# Patient Record
Sex: Female | Born: 1960
Health system: Southern US, Community
[De-identification: ages and names within clinical notes are randomized; demographics above are authoritative.]

## PROBLEM LIST (undated history)

## (undated) DIAGNOSIS — G1221 Amyotrophic lateral sclerosis: Secondary | ICD-10-CM

## (undated) DIAGNOSIS — M1990 Unspecified osteoarthritis, unspecified site: Secondary | ICD-10-CM

## (undated) DIAGNOSIS — I10 Essential (primary) hypertension: Secondary | ICD-10-CM

## (undated) DIAGNOSIS — F419 Anxiety disorder, unspecified: Secondary | ICD-10-CM

## (undated) DIAGNOSIS — G709 Myoneural disorder, unspecified: Secondary | ICD-10-CM

## (undated) DIAGNOSIS — R0600 Dyspnea, unspecified: Secondary | ICD-10-CM

## (undated) DIAGNOSIS — T4145XA Adverse effect of unspecified anesthetic, initial encounter: Secondary | ICD-10-CM

## (undated) DIAGNOSIS — F329 Major depressive disorder, single episode, unspecified: Secondary | ICD-10-CM

## (undated) DIAGNOSIS — T8859XA Other complications of anesthesia, initial encounter: Secondary | ICD-10-CM

## (undated) DIAGNOSIS — F32A Depression, unspecified: Secondary | ICD-10-CM

## (undated) HISTORY — PX: BILATERAL CARPAL TUNNEL RELEASE: SHX6508

## (undated) HISTORY — PX: APPENDECTOMY: SHX54

## (undated) HISTORY — PX: JOINT REPLACEMENT: SHX530

---

## 2006-04-03 ENCOUNTER — Ambulatory Visit (HOSPITAL_COMMUNITY): Admission: RE | Admit: 2006-04-03 | Discharge: 2006-04-03 | Payer: Self-pay | Admitting: Orthopedic Surgery

## 2010-07-05 ENCOUNTER — Encounter
Admission: RE | Admit: 2010-07-05 | Discharge: 2010-07-05 | Payer: Self-pay | Source: Home / Self Care | Attending: Rheumatology | Admitting: Rheumatology

## 2010-11-10 NOTE — Op Note (Signed)
NAME:  Tara Whitehead, Tara Whitehead               ACCOUNT NO.:  [id number]   MEDICAL RECORD NO.:  [id number]          PATIENT TYPE:  AMB   LOCATION:  SDS                          FACILITY:  [facility]   PHYSICIAN:  Artist Pais. Weingold, M.D.DATE OF BIRTH:  [birth year]   DATE OF PROCEDURE:  04/03/2006  DATE OF DISCHARGE:                                 OPERATIVE REPORT   PREOPERATIVE DIAGNOSIS:  Chronic left carpal tunnel syndrome.   POSTOPERATIVE DIAGNOSIS:  Chronic left carpal tunnel syndrome.   SURGERY:  Left carpal tunnel release.   SURGEON:  Artist Pais. Mina Marble, M.D.   ASSISTANT:  None.   ANESTHESIA:  General.   TOURNIQUET TIME:  16 minutes.   COMPLICATIONS:  None.   DRAINS:  None.   OPERATIVE REPORT:  The patient was taken to the operating suite after the  induction of adequate general anesthesia.  The left upper extremity was  prepped and draped in the usual sterile fashion.  An esmarch was used to  exsanguinate the limb.  Tourniquet was then inflated to 275 mmHg.  At this  point in time, a 2-cm incision was made in the palmar aspect of the left  hand in line with the long finger metacarpal, starting at Kaplan's cardinal  line.  The skin was incised.  The palmar fascia was identified and split.  The distal edge of the transverse carpal ligament was identified and split  with a 15-blade.  The Therapist, nutritional was used to identify and protect the  median nerve.  Once this was done, the remaining aspect of the transverse  carpal ligament was divided under direct vision using a curved blunt  scissor.  The canal was inspected.  There were no osseous lesions or  ganglions present.  It was irrigated and loosely closed with 3-0 Prolene  subcuticular stitch.  Steri-Strips, 4 x 4 fluffs and a compressive dressing  were applied.  The patient tolerated the procedure well and was taken to the  recovery room in a stable fashion.      Artist Pais Mina Marble, M.D.  Electronically Signed     MAW/MEDQ   D:  04/03/2006  T:  04/04/2006  Job:  [id number]

## 2016-11-23 DIAGNOSIS — G1221 Amyotrophic lateral sclerosis: Secondary | ICD-10-CM

## 2016-11-23 HISTORY — DX: Amyotrophic lateral sclerosis: G12.21

## 2017-05-27 DIAGNOSIS — R32 Unspecified urinary incontinence: Secondary | ICD-10-CM | POA: Diagnosis not present

## 2017-05-27 DIAGNOSIS — N3281 Overactive bladder: Secondary | ICD-10-CM | POA: Diagnosis not present

## 2017-06-26 DIAGNOSIS — R253 Fasciculation: Secondary | ICD-10-CM | POA: Diagnosis not present

## 2017-06-26 DIAGNOSIS — R292 Abnormal reflex: Secondary | ICD-10-CM | POA: Diagnosis not present

## 2017-06-26 DIAGNOSIS — G1221 Amyotrophic lateral sclerosis: Secondary | ICD-10-CM | POA: Diagnosis not present

## 2017-06-26 DIAGNOSIS — R531 Weakness: Secondary | ICD-10-CM | POA: Diagnosis not present

## 2017-07-01 DIAGNOSIS — N3941 Urge incontinence: Secondary | ICD-10-CM | POA: Diagnosis not present

## 2017-07-01 DIAGNOSIS — R3914 Feeling of incomplete bladder emptying: Secondary | ICD-10-CM | POA: Diagnosis not present

## 2017-07-02 ENCOUNTER — Other Ambulatory Visit: Payer: Self-pay | Admitting: Urology

## 2017-07-04 ENCOUNTER — Other Ambulatory Visit: Payer: Self-pay

## 2017-07-04 ENCOUNTER — Encounter (HOSPITAL_COMMUNITY): Payer: Self-pay | Admitting: *Deleted

## 2017-07-04 NOTE — Progress Notes (Signed)
Spoke with Anesthesia and Patient does not need to be seen by anesthesia prior to surgery regarding ALS diagnosis.  She can be evaluated am of surgery per Anesthesia.

## 2017-07-08 DIAGNOSIS — G1221 Amyotrophic lateral sclerosis: Secondary | ICD-10-CM | POA: Diagnosis not present

## 2017-07-08 DIAGNOSIS — R05 Cough: Secondary | ICD-10-CM | POA: Diagnosis not present

## 2017-07-08 DIAGNOSIS — R0989 Other specified symptoms and signs involving the circulatory and respiratory systems: Secondary | ICD-10-CM | POA: Diagnosis not present

## 2017-07-10 NOTE — Anesthesia Preprocedure Evaluation (Addendum)
Anesthesia Evaluation  Patient identified by MRN, date of birth, ID band Patient awake    Reviewed: Allergy & Precautions, NPO status , Patient's Chart, lab work & pertinent test results  History of Anesthesia Complications Negative for: history of anesthetic complications  Airway Mallampati: II  TM Distance: >3 FB Neck ROM: Full    Dental no notable dental hx. (+) Dental Advisory Given   Pulmonary shortness of breath and with exertion, Current Smoker,    Pulmonary exam normal        Cardiovascular hypertension, negative cardio ROS Normal cardiovascular exam     Neuro/Psych PSYCHIATRIC DISORDERS Anxiety Depression ALS    GI/Hepatic negative GI ROS, Neg liver ROS,   Endo/Other  Morbid obesity  Renal/GU negative Renal ROS     Musculoskeletal negative musculoskeletal ROS (+)   Abdominal   Peds  Hematology negative hematology ROS (+)   Anesthesia Other Findings Day of surgery medications reviewed with the patient.  Reproductive/Obstetrics                            Anesthesia Physical Anesthesia Plan  ASA: III  Anesthesia Plan: MAC   Post-op Pain Management:    Induction:   PONV Risk Score and Plan: 2 and Ondansetron and Dexamethasone  Airway Management Planned: Natural Airway  Additional Equipment:   Intra-op Plan:   Post-operative Plan:   Informed Consent: I have reviewed the patients History and Physical, chart, labs and discussed the procedure including the risks, benefits and alternatives for the proposed anesthesia with the patient or authorized representative who has indicated his/her understanding and acceptance.   Dental advisory given  Plan Discussed with: CRNA, Anesthesiologist and Surgeon  Anesthesia Plan Comments:        Anesthesia Quick Evaluation

## 2017-07-10 NOTE — H&P (Signed)
CC/HPI: I leak when I have the urge to urinate.     Tara Whitehead is a 57 yo with a 6 month history of ALS. She has UUI and had UDS at [facility] in December that showed a capacity bladder with instability. She is confined to a wheelchair and has both day and nighttime UUI. She has had no UTI's. She is voiding into depends and that aggravates the hydradinitis that she has. She has no other GU history. She has a horseshoe kidney.     ALLERGIES: None   MEDICATIONS: None   GU PSH: None   NON-GU PSH: Appendectomy (laparoscopic) Carpal tunnel surgery, Bilateral Knee Arthroscopy/surgery, Bilateral    GU PMH: None   NON-GU PMH: Amyotrophic lateral sclerosis Anxiety Arthritis Depression Hypertension Sleep Apnea    FAMILY HISTORY: Heart Attack - Brother multiple sclerosis - Mother   SOCIAL HISTORY: None   REVIEW OF SYSTEMS:    GU Review Female:   Patient reports frequent urination, hard to postpone urination, get up at night to urinate, and leakage of urine. Patient denies burning /pain with urination, stream starts and stops, trouble starting your stream, have to strain to urinate, and being pregnant.  Gastrointestinal (Upper):   Patient denies nausea, vomiting, and indigestion/ heartburn.  Gastrointestinal (Lower):   Patient denies diarrhea and constipation.  Constitutional:   Patient reports fatigue. Patient denies fever, night sweats, and weight loss.  Skin:   Patient reports skin rash/ lesion. Patient denies itching.  Eyes:   Patient denies blurred vision and double vision.  Ears/ Nose/ Throat:   Patient reports sore throat. Patient denies sinus problems.  Hematologic/Lymphatic:   Patient denies swollen glands and easy bruising.  Cardiovascular:   Patient reports leg swelling. Patient denies chest pains.  Respiratory:   Patient reports cough and shortness of breath.   Endocrine:   Patient denies excessive thirst.  Musculoskeletal:   Patient denies back pain and joint pain.   Neurological:   Patient denies headaches and dizziness.  Psychologic:   Patient reports depression and anxiety.    VITAL SIGNS:      07/01/2017 10:16 AM  Weight 270 lb / 122.47 kg  Height 71 in / 180.34 cm  BP 166/77 mmHg  Heart Rate 82 /min  Temperature 97.4 F / 36.3 C  BMI 37.7 kg/m   GU PHYSICAL EXAMINATION:    External Genitalia: She has erythema on the groins and labial with a cream applied.    MULTI-SYSTEM PHYSICAL EXAMINATION:    Constitutional: Well-nourished. No physical deformities. Normally developed. Good grooming.  Neck: Neck symmetrical, not swollen. Normal tracheal position.  Respiratory: No labored breathing, no use of accessory muscles. CTA  Cardiovascular: Normal temperature, RRR without murmur.  Lymphatic: No enlargement of neck, axillae, groin.  Skin: No paleness, no jaundice, no cyanosis. No lesion, no ulcer, no rash.  Neurologic / Psychiatric: Oriented to time, oriented to place, oriented to person. No depression, no anxiety, no agitation. She has quadriparesis.   Gastrointestinal: Obese abdomen. No mass, no tenderness, no rigidity.   Musculoskeletal: Normal gait and station of head and neck.     PAST DATA REVIEWED:  Source Of History:  Patient  Urodynamics Review:   Review Urodynamics Tests   PROCEDURES:          Catheter / SP Tube - 51701 In and Out Catheterization  A 14 French red rubber or straight catheter was inserted into the bladder using sterile technique. A urinalysis was sent to the lab. 260  cc of urine was obtained. A urine culture was sent to the lab.         Urinalysis Dipstick Dipstick Cont'd  Color: Yellow Bilirubin: Neg  Appearance: Clear Ketones: Neg  Specific Gravity: 1.020 Blood: Neg  pH: 6.5 Protein: Neg  Glucose: Neg Urobilinogen: 0.2    Nitrites: Neg    Leukocyte Esterase: Neg    ASSESSMENT:      ICD-10 Details  1 GU:   Urge incontinence - N39.41 She has an unstable small capacity bladder with incontinence and ALS  which limits mobility. She has not tolerated a foley and couldn't do CIC. I will get her set up for cystoscopy in botox and placement of an SP tube. I reviewed the risks of bleeding, infection, injury to bowel or other structures, neurologic side effects from the botox, thrombotic events and anesthetic complications. Cath UA was clear so she doesn't need a culture.   2   Incomplete bladder emptying - R39.14 PVR was   PLAN:           Schedule Return Visit/Planned Activity: Next Available Appointment - Schedule Surgery          Document Letter(s):  Created for Patient: Clinical Summary

## 2017-07-11 ENCOUNTER — Inpatient Hospital Stay (HOSPITAL_COMMUNITY)
Admission: RE | Admit: 2017-07-11 | Discharge: 2017-07-23 | DRG: 698 | Disposition: A | Discharge status: Hospice | Payer: PPO | Source: Ambulatory Visit | Attending: Internal Medicine | Admitting: Internal Medicine

## 2017-07-11 ENCOUNTER — Ambulatory Visit (HOSPITAL_COMMUNITY): Payer: PPO | Admitting: Anesthesiology

## 2017-07-11 ENCOUNTER — Other Ambulatory Visit: Payer: Self-pay

## 2017-07-11 ENCOUNTER — Encounter (HOSPITAL_COMMUNITY)
Admission: RE | Disposition: A | Discharge status: Hospice | Payer: Self-pay | Source: Ambulatory Visit | Attending: Internal Medicine

## 2017-07-11 ENCOUNTER — Encounter (HOSPITAL_COMMUNITY): Payer: Self-pay | Admitting: *Deleted

## 2017-07-11 DIAGNOSIS — Q631 Lobulated, fused and horseshoe kidney: Secondary | ICD-10-CM

## 2017-07-11 DIAGNOSIS — J9621 Acute and chronic respiratory failure with hypoxia: Secondary | ICD-10-CM | POA: Diagnosis not present

## 2017-07-11 DIAGNOSIS — J181 Lobar pneumonia, unspecified organism: Secondary | ICD-10-CM | POA: Diagnosis not present

## 2017-07-11 DIAGNOSIS — I10 Essential (primary) hypertension: Secondary | ICD-10-CM | POA: Diagnosis present

## 2017-07-11 DIAGNOSIS — J9811 Atelectasis: Secondary | ICD-10-CM | POA: Diagnosis not present

## 2017-07-11 DIAGNOSIS — Z8249 Family history of ischemic heart disease and other diseases of the circulatory system: Secondary | ICD-10-CM

## 2017-07-11 DIAGNOSIS — Z79899 Other long term (current) drug therapy: Secondary | ICD-10-CM

## 2017-07-11 DIAGNOSIS — F17210 Nicotine dependence, cigarettes, uncomplicated: Secondary | ICD-10-CM | POA: Diagnosis present

## 2017-07-11 DIAGNOSIS — Z6841 Body Mass Index (BMI) 40.0 and over, adult: Secondary | ICD-10-CM | POA: Diagnosis not present

## 2017-07-11 DIAGNOSIS — Z91048 Other nonmedicinal substance allergy status: Secondary | ICD-10-CM | POA: Diagnosis not present

## 2017-07-11 DIAGNOSIS — N3941 Urge incontinence: Secondary | ICD-10-CM | POA: Diagnosis not present

## 2017-07-11 DIAGNOSIS — Z435 Encounter for attention to cystostomy: Secondary | ICD-10-CM | POA: Diagnosis not present

## 2017-07-11 DIAGNOSIS — G8194 Hemiplegia, unspecified affecting left nondominant side: Secondary | ICD-10-CM | POA: Diagnosis not present

## 2017-07-11 DIAGNOSIS — J9600 Acute respiratory failure, unspecified whether with hypoxia or hypercapnia: Secondary | ICD-10-CM | POA: Diagnosis not present

## 2017-07-11 DIAGNOSIS — Z515 Encounter for palliative care: Secondary | ICD-10-CM | POA: Diagnosis not present

## 2017-07-11 DIAGNOSIS — R32 Unspecified urinary incontinence: Secondary | ICD-10-CM | POA: Diagnosis not present

## 2017-07-11 DIAGNOSIS — J189 Pneumonia, unspecified organism: Secondary | ICD-10-CM | POA: Diagnosis not present

## 2017-07-11 DIAGNOSIS — Z95810 Presence of automatic (implantable) cardiac defibrillator: Secondary | ICD-10-CM

## 2017-07-11 DIAGNOSIS — Z7189 Other specified counseling: Secondary | ICD-10-CM

## 2017-07-11 DIAGNOSIS — E441 Mild protein-calorie malnutrition: Secondary | ICD-10-CM | POA: Diagnosis not present

## 2017-07-11 DIAGNOSIS — G8250 Quadriplegia, unspecified: Secondary | ICD-10-CM | POA: Diagnosis not present

## 2017-07-11 DIAGNOSIS — J90 Pleural effusion, not elsewhere classified: Secondary | ICD-10-CM | POA: Diagnosis not present

## 2017-07-11 DIAGNOSIS — R0602 Shortness of breath: Secondary | ICD-10-CM

## 2017-07-11 DIAGNOSIS — Z820 Family history of epilepsy and other diseases of the nervous system: Secondary | ICD-10-CM

## 2017-07-11 DIAGNOSIS — K5900 Constipation, unspecified: Secondary | ICD-10-CM | POA: Diagnosis present

## 2017-07-11 DIAGNOSIS — T83038A Leakage of other indwelling urethral catheter, initial encounter: Secondary | ICD-10-CM | POA: Diagnosis not present

## 2017-07-11 DIAGNOSIS — N319 Neuromuscular dysfunction of bladder, unspecified: Principal | ICD-10-CM | POA: Diagnosis present

## 2017-07-11 DIAGNOSIS — I5030 Unspecified diastolic (congestive) heart failure: Secondary | ICD-10-CM | POA: Diagnosis not present

## 2017-07-11 DIAGNOSIS — G1221 Amyotrophic lateral sclerosis: Secondary | ICD-10-CM

## 2017-07-11 DIAGNOSIS — F418 Other specified anxiety disorders: Secondary | ICD-10-CM | POA: Diagnosis not present

## 2017-07-11 DIAGNOSIS — J9601 Acute respiratory failure with hypoxia: Secondary | ICD-10-CM | POA: Diagnosis not present

## 2017-07-11 DIAGNOSIS — Y95 Nosocomial condition: Secondary | ICD-10-CM | POA: Diagnosis present

## 2017-07-11 DIAGNOSIS — R0902 Hypoxemia: Secondary | ICD-10-CM

## 2017-07-11 DIAGNOSIS — Z66 Do not resuscitate: Secondary | ICD-10-CM | POA: Diagnosis not present

## 2017-07-11 DIAGNOSIS — E876 Hypokalemia: Secondary | ICD-10-CM | POA: Diagnosis present

## 2017-07-11 DIAGNOSIS — T884XXA Failed or difficult intubation, initial encounter: Secondary | ICD-10-CM | POA: Diagnosis not present

## 2017-07-11 DIAGNOSIS — L8990 Pressure ulcer of unspecified site, unspecified stage: Secondary | ICD-10-CM

## 2017-07-11 DIAGNOSIS — R339 Retention of urine, unspecified: Secondary | ICD-10-CM | POA: Diagnosis present

## 2017-07-11 DIAGNOSIS — A419 Sepsis, unspecified organism: Secondary | ICD-10-CM

## 2017-07-11 DIAGNOSIS — R000 Tachycardia, unspecified: Secondary | ICD-10-CM | POA: Diagnosis present

## 2017-07-11 DIAGNOSIS — L89151 Pressure ulcer of sacral region, stage 1: Secondary | ICD-10-CM | POA: Diagnosis present

## 2017-07-11 DIAGNOSIS — J9690 Respiratory failure, unspecified, unspecified whether with hypoxia or hypercapnia: Secondary | ICD-10-CM | POA: Diagnosis not present

## 2017-07-11 DIAGNOSIS — G4730 Sleep apnea, unspecified: Secondary | ICD-10-CM | POA: Diagnosis present

## 2017-07-11 DIAGNOSIS — R0689 Other abnormalities of breathing: Secondary | ICD-10-CM | POA: Diagnosis not present

## 2017-07-11 DIAGNOSIS — Z993 Dependence on wheelchair: Secondary | ICD-10-CM

## 2017-07-11 HISTORY — DX: Anxiety disorder, unspecified: F41.9

## 2017-07-11 HISTORY — DX: Depression, unspecified: F32.A

## 2017-07-11 HISTORY — DX: Adverse effect of unspecified anesthetic, initial encounter: T41.45XA

## 2017-07-11 HISTORY — DX: Myoneural disorder, unspecified: G70.9

## 2017-07-11 HISTORY — DX: Major depressive disorder, single episode, unspecified: F32.9

## 2017-07-11 HISTORY — DX: Amyotrophic lateral sclerosis: G12.21

## 2017-07-11 HISTORY — DX: Dyspnea, unspecified: R06.00

## 2017-07-11 HISTORY — DX: Essential (primary) hypertension: I10

## 2017-07-11 HISTORY — DX: Unspecified osteoarthritis, unspecified site: M19.90

## 2017-07-11 HISTORY — DX: Other complications of anesthesia, initial encounter: T88.59XA

## 2017-07-11 HISTORY — PX: BOTOX INJECTION: SHX5754

## 2017-07-11 HISTORY — PX: INSERTION OF SUPRAPUBIC CATHETER: SHX5870

## 2017-07-11 LAB — CBC
HCT: 50.4 % — ABNORMAL HIGH (ref 36.0–46.0)
Hemoglobin: 17.1 g/dL — ABNORMAL HIGH (ref 12.0–15.0)
MCH: 29.3 pg (ref 26.0–34.0)
MCHC: 33.9 g/dL (ref 30.0–36.0)
MCV: 86.4 fL (ref 78.0–100.0)
PLATELETS: 259 10*3/uL (ref 150–400)
RBC: 5.83 MIL/uL — ABNORMAL HIGH (ref 3.87–5.11)
RDW: 15.2 % (ref 11.5–15.5)
WBC: 12.7 10*3/uL — ABNORMAL HIGH (ref 4.0–10.5)

## 2017-07-11 LAB — BASIC METABOLIC PANEL
Anion gap: 7 (ref 5–15)
BUN: 17 mg/dL (ref 6–20)
CALCIUM: 9.4 mg/dL (ref 8.9–10.3)
CO2: 33 mmol/L — ABNORMAL HIGH (ref 22–32)
Chloride: 102 mmol/L (ref 101–111)
GLUCOSE: 103 mg/dL — AB (ref 65–99)
Potassium: 4.9 mmol/L (ref 3.5–5.1)
SODIUM: 142 mmol/L (ref 135–145)

## 2017-07-11 SURGERY — BOTOX INJECTION
Anesthesia: Monitor Anesthesia Care

## 2017-07-11 MED ORDER — BUPROPION HCL ER (XL) 150 MG PO TB24
150.0000 mg | ORAL_TABLET | Freq: Every day | ORAL | Status: DC
  Administered 2017-07-12 – 2017-07-15 (×3): 150 mg via ORAL
  Filled 2017-07-11 (×3): qty 1

## 2017-07-11 MED ORDER — FENTANYL CITRATE (PF) 100 MCG/2ML IJ SOLN
INTRAMUSCULAR | Status: AC
  Filled 2017-07-11: qty 2

## 2017-07-11 MED ORDER — ONABOTULINUMTOXINA 100 UNITS IJ SOLR
100.0000 [IU] | Freq: Once | INTRAMUSCULAR | Status: DC
  Filled 2017-07-11: qty 100

## 2017-07-11 MED ORDER — BELLADONNA-OPIUM 16.2-30 MG RE SUPP: 1.0000 | Freq: Four times a day (QID) | RECTAL | Status: DC | PRN

## 2017-07-11 MED ORDER — SENNOSIDES-DOCUSATE SODIUM 8.6-50 MG PO TABS
2.0000 | ORAL_TABLET | Freq: Every day | ORAL | Status: DC
  Administered 2017-07-11 – 2017-07-14 (×2): 2 via ORAL
  Filled 2017-07-11 (×2): qty 2

## 2017-07-11 MED ORDER — SODIUM CHLORIDE 0.9 % IJ SOLN
INTRAMUSCULAR | Status: DC | PRN
  Administered 2017-07-11: 30 mL

## 2017-07-11 MED ORDER — CEFAZOLIN SODIUM-DEXTROSE 2-4 GM/100ML-% IV SOLN
2.0000 g | Freq: Three times a day (TID) | INTRAVENOUS | Status: AC
  Administered 2017-07-11 – 2017-07-12 (×2): 2 g via INTRAVENOUS
  Filled 2017-07-11 (×2): qty 100

## 2017-07-11 MED ORDER — HYDRALAZINE HCL 20 MG/ML IJ SOLN
INTRAMUSCULAR | Status: AC
  Filled 2017-07-11: qty 1

## 2017-07-11 MED ORDER — ONABOTULINUMTOXINA 100 UNITS IJ SOLR
100.0000 [IU] | Freq: Once | INTRAMUSCULAR | Status: AC
  Administered 2017-07-11: 200 [IU] via INTRAMUSCULAR
  Filled 2017-07-11: qty 100

## 2017-07-11 MED ORDER — MIDAZOLAM HCL 2 MG/2ML IJ SOLN
INTRAMUSCULAR | Status: AC
  Filled 2017-07-11: qty 2

## 2017-07-11 MED ORDER — ONDANSETRON HCL 4 MG/2ML IJ SOLN: 4.0000 mg | INTRAMUSCULAR | Status: DC | PRN

## 2017-07-11 MED ORDER — ONDANSETRON HCL 4 MG/2ML IJ SOLN
INTRAMUSCULAR | Status: DC | PRN
  Administered 2017-07-11: 4 mg via INTRAVENOUS

## 2017-07-11 MED ORDER — CLOTRIMAZOLE 1 % EX CREA: 1.0000 "application " | TOPICAL_CREAM | Freq: Two times a day (BID) | CUTANEOUS | Status: DC | PRN

## 2017-07-11 MED ORDER — DOCUSATE SODIUM 100 MG PO CAPS
100.0000 mg | ORAL_CAPSULE | Freq: Two times a day (BID) | ORAL | Status: DC
  Administered 2017-07-11 – 2017-07-15 (×5): 100 mg via ORAL
  Filled 2017-07-11 (×6): qty 1

## 2017-07-11 MED ORDER — ACETAMINOPHEN 325 MG PO TABS
650.0000 mg | ORAL_TABLET | ORAL | Status: DC | PRN
  Administered 2017-07-11: 650 mg via ORAL
  Filled 2017-07-11: qty 2

## 2017-07-11 MED ORDER — CEFAZOLIN SODIUM-DEXTROSE 2-4 GM/100ML-% IV SOLN
2.0000 g | INTRAVENOUS | Status: AC
  Administered 2017-07-11: 2 g via INTRAVENOUS
  Filled 2017-07-11: qty 100

## 2017-07-11 MED ORDER — PROMETHAZINE HCL 25 MG/ML IJ SOLN: 6.2500 mg | INTRAMUSCULAR | Status: DC | PRN

## 2017-07-11 MED ORDER — FENTANYL CITRATE (PF) 100 MCG/2ML IJ SOLN
INTRAMUSCULAR | Status: DC | PRN
  Administered 2017-07-11: 100 ug via INTRAVENOUS

## 2017-07-11 MED ORDER — OXYBUTYNIN CHLORIDE ER 5 MG PO TB24
10.0000 mg | ORAL_TABLET | Freq: Every day | ORAL | Status: DC
  Administered 2017-07-12 – 2017-07-15 (×3): 10 mg via ORAL
  Filled 2017-07-11: qty 1
  Filled 2017-07-11 (×2): qty 2

## 2017-07-11 MED ORDER — SODIUM CHLORIDE 0.9 % IR SOLN
Status: DC | PRN
  Administered 2017-07-11: 3000 mL via INTRAVESICAL

## 2017-07-11 MED ORDER — TRIAMCINOLONE ACETONIDE 0.1 % EX CREA
1.0000 "application " | TOPICAL_CREAM | Freq: Two times a day (BID) | CUTANEOUS | Status: DC | PRN
  Administered 2017-07-20: 1 via TOPICAL

## 2017-07-11 MED ORDER — DEXAMETHASONE SODIUM PHOSPHATE 10 MG/ML IJ SOLN
INTRAMUSCULAR | Status: DC | PRN
  Administered 2017-07-11: 10 mg via INTRAVENOUS

## 2017-07-11 MED ORDER — PROPOFOL 10 MG/ML IV BOLUS
INTRAVENOUS | Status: AC
  Filled 2017-07-11: qty 40

## 2017-07-11 MED ORDER — SODIUM CHLORIDE 0.9 % IJ SOLN
INTRAMUSCULAR | Status: AC
Start: 2017-07-11 — End: ?
  Filled 2017-07-11: qty 50

## 2017-07-11 MED ORDER — LIDOCAINE 2% (20 MG/ML) 5 ML SYRINGE
INTRAMUSCULAR | Status: DC | PRN
  Administered 2017-07-11 (×2): 100 mg via INTRAVENOUS

## 2017-07-11 MED ORDER — ORAL CARE MOUTH RINSE
15.0000 mL | Freq: Two times a day (BID) | OROMUCOSAL | Status: DC
  Administered 2017-07-11 – 2017-07-22 (×14): 15 mL via OROMUCOSAL

## 2017-07-11 MED ORDER — LABETALOL HCL 5 MG/ML IV SOLN
INTRAVENOUS | Status: DC | PRN
  Administered 2017-07-11 (×2): 10 mg via INTRAVENOUS

## 2017-07-11 MED ORDER — VENLAFAXINE HCL ER 75 MG PO CP24
225.0000 mg | ORAL_CAPSULE | Freq: Every day | ORAL | Status: DC
  Administered 2017-07-12 – 2017-07-14 (×2): 225 mg via ORAL
  Filled 2017-07-11 (×4): qty 1

## 2017-07-11 MED ORDER — FENTANYL CITRATE (PF) 100 MCG/2ML IJ SOLN: 25.0000 ug | INTRAMUSCULAR | Status: DC | PRN

## 2017-07-11 MED ORDER — AMLODIPINE BESYLATE 5 MG PO TABS: 5.0000 mg | ORAL_TABLET | Freq: Every day | ORAL | Status: DC

## 2017-07-11 MED ORDER — PROPOFOL 10 MG/ML IV BOLUS
INTRAVENOUS | Status: AC
  Filled 2017-07-11: qty 20

## 2017-07-11 MED ORDER — LACTATED RINGERS IV SOLN
INTRAVENOUS | Status: DC
  Administered 2017-07-11 (×2): via INTRAVENOUS

## 2017-07-11 MED ORDER — OXYCODONE HCL 5 MG PO TABS
5.0000 mg | ORAL_TABLET | ORAL | Status: DC | PRN
  Administered 2017-07-12 – 2017-07-14 (×2): 5 mg via ORAL
  Filled 2017-07-11 (×2): qty 1

## 2017-07-11 MED ORDER — HYDRALAZINE HCL 20 MG/ML IJ SOLN
5.0000 mg | Freq: Once | INTRAMUSCULAR | Status: AC
  Administered 2017-07-11: 5 mg via INTRAVENOUS

## 2017-07-11 MED ORDER — PROPOFOL 10 MG/ML IV BOLUS
INTRAVENOUS | Status: AC
Start: 2017-07-11 — End: ?
  Filled 2017-07-11: qty 20

## 2017-07-11 MED ORDER — MORPHINE SULFATE (PF) 4 MG/ML IV SOLN: 2.0000 mg | INTRAVENOUS | Status: DC | PRN

## 2017-07-11 MED ORDER — LABETALOL HCL 5 MG/ML IV SOLN
INTRAVENOUS | Status: AC
  Filled 2017-07-11: qty 4

## 2017-07-11 MED ORDER — LIDOCAINE 2% (20 MG/ML) 5 ML SYRINGE
INTRAMUSCULAR | Status: AC
  Filled 2017-07-11: qty 5

## 2017-07-11 MED ORDER — PROPOFOL 10 MG/ML IV BOLUS
INTRAVENOUS | Status: DC | PRN
  Administered 2017-07-11: 100 mg via INTRAVENOUS
  Administered 2017-07-11: 10 mg via INTRAVENOUS
  Administered 2017-07-11: 20 mg via INTRAVENOUS
  Administered 2017-07-11: 50 mg via INTRAVENOUS
  Administered 2017-07-11: 40 mg via INTRAVENOUS
  Administered 2017-07-11: 30 mg via INTRAVENOUS

## 2017-07-11 MED ORDER — PROPOFOL 500 MG/50ML IV EMUL
INTRAVENOUS | Status: DC | PRN
  Administered 2017-07-11: 75 ug/kg/min via INTRAVENOUS

## 2017-07-11 SURGICAL SUPPLY — 29 items
BAG URINE DRAINAGE (UROLOGICAL SUPPLIES) ×5 IMPLANT
BAG URINE LEG 500ML (DRAIN) ×1 IMPLANT
BLADE SURG 15 STRL LF DISP TIS (BLADE) ×1 IMPLANT
BLADE SURG 15 STRL SS (BLADE) ×3
CATH FOLEY 2W COUNCIL 5CC 16FR (CATHETERS) ×2 IMPLANT
CATH FOLEY 2WAY SLVR  5CC 20FR (CATHETERS)
CATH FOLEY 2WAY SLVR  5CC 22FR (CATHETERS) ×2
CATH FOLEY 2WAY SLVR 5CC 20FR (CATHETERS) ×1 IMPLANT
CATH FOLEY 2WAY SLVR 5CC 22FR (CATHETERS) IMPLANT
CATH FOLEY INTRO SUPRA 16F (CATHETERS) ×2 IMPLANT
CATH URET 5FR 28IN OPEN ENDED (CATHETERS) IMPLANT
ELECT PENCIL ROCKER SW 15FT (MISCELLANEOUS) IMPLANT
ELECT REM PT RETURN 15FT ADLT (MISCELLANEOUS) ×3 IMPLANT
GLOVE SURG SS PI 8.0 STRL IVOR (GLOVE) ×2 IMPLANT
GOWN STRL REUS W/TWL XL LVL3 (GOWN DISPOSABLE) ×4 IMPLANT
HOLDER FOLEY CATH W/STRAP (MISCELLANEOUS) ×2 IMPLANT
MANIFOLD NEPTUNE II (INSTRUMENTS) ×3 IMPLANT
NDL SPNL 18GX3.5 QUINCKE PK (NEEDLE) IMPLANT
NEEDLE HYPO 22GX1.5 SAFETY (NEEDLE) ×2 IMPLANT
NEEDLE SPNL 18GX3.5 QUINCKE PK (NEEDLE) ×3 IMPLANT
NS IRRIG 1000ML POUR BTL (IV SOLUTION) ×3 IMPLANT
PACK CYSTO (CUSTOM PROCEDURE TRAY) ×3 IMPLANT
PLUG CATH AND CAP STER (CATHETERS) ×2 IMPLANT
SPONGE DRAIN TRACH 4X4 STRL 2S (GAUZE/BANDAGES/DRESSINGS) ×3 IMPLANT
SUT ETHILON 2 0 PS N (SUTURE) ×3 IMPLANT
SYR 30ML LL (SYRINGE) ×2 IMPLANT
TOWEL OR 17X26 10 PK STRL BLUE (TOWEL DISPOSABLE) ×3 IMPLANT
WATER STERILE IRR 3000ML UROMA (IV SOLUTION) ×1 IMPLANT
WATER STERILE IRR 500ML POUR (IV SOLUTION) ×2 IMPLANT

## 2017-07-11 NOTE — Op Note (Signed)
Urology Operative Note   Preoperative Diagnosis: Neurogenic bladder  Postoperative Diagnosis: Neurogenic bladder  Procedure(s) Performed:   1. Cystourethroscopy 2. Instillation of 200 units of Onobotulinum Toxin A  3. Insertion of 16Fr council tip suprapubic tube    Teaching Surgeon:  Bjorn PippinJohn Liadan Guizar, MD  Resident Surgeon:  Callie FieldingPauline Filippou, MD  Assistant(s):  None  Anesthesia:  General via endotracheal tube    Fluids:  See anesthesia record  Estimated blood loss:  5 mL  Specimens:  NOne  Cultures:  None  Drains:  16Fr council tip foley catheter as suprapubic tube, to drainage 22Fr 2-way foley catheter to drainage per urethra   Complications:  None  Indications: Tara Whitehead is a 57 y.o. female with a history of ALS and neurogenic bladder with chronic bladder drainage via foley catheter. She also has intermittent detrusor overactivity incontinence.  she presents today for suprapubic tube placement and cystourethroscopy with Botox injection. Risks & benefits of the procedure discussed with the patient, who wishes to proceed.  Findings:   1) Cystourethroscopy demonstrated normal appearing bladder without masses or lesions 2) Injection of 200 units of botox mixed with 30cc normal saline injected throughout the bladder  3) Successful placement of 16Fr council tip foley catheter suprapubic tube   Description:  The patient was correctly identified in the preop holding area where written informed consent as well potential risk and complication reviewed.  They were brought back to the operative suite where a preinduction timeout was performed. Once correct information was verified, general anesthesia was induced via endotracheal tube. They were then gently placed into dorsal lithotomy position with SCDs in place for VTE prophylaxis. They were prepped and draped in the usual sterile fashion and given appropriate preoperative antibiotics with cefazolin. A second timeout was then performed.    A 2422fr cystourethroscope was inserted per urethra with normal saline irrigation running. Thorough cystourethroscopy was performed, which demonstrated a normal appearing bladder with no masses or lesions. Bilateral ureteral orifices were identified in normal anatomic position.   The cystoscope was then switched out for a 22Fr cystoscope with iglesias working element, with injection needle attachment. The injection needle was used to inject a total of 200 units of Botox which had been dissolved in 30cc of normal saline. This was injected throughout the bladder wall, in 5 rows of 6 injections. 4cc was used to inject the trigone, staying clear of the ureteral orifices bilaterally. After completion, injection sites were observed, and were noted to be hemostatic. The bladder was emptied.   We then focused our attention on suprapubic tube placement. We reinserted our cystocope and filled the bladder to capacity. A midline incision was made 2 fingerbreadths above the pubic bone.  This determination was somewhat difficult because of her generous panniculus.    A spinal needle was used to orient ourselves, and visualization of percutaneous access to the bladder was confirmed visually with our cystoscope. We then inserted a 16Fr percutaneous suprapubic introducer under direct visualization through our midline incision. On the initial puncture, there was a through and through puncture of the left lateral wall but upon retraction of the tip, there was minimal bleeding that quickly stopped.   Once inside the bladder, a 16Fr council tip foley catheter was inserted into the bladder, and balloon filled with 10cc of sterile water. Balloon placement seeded against the wall of the bladder was confirmed visually. The bladder was emptied, all instrumentation was removed. The suprapubic tube was secured to the skin using a 2-0  nylon suture. Site was dressed.   Because of the bladder perforation lateral and the small size of the  suprapubic catheter, it was felt that maximizing drainage overnight should there be late bleeding would be the safest course of action.  We then inserted a 45fr foley catheter per urethra in standard fashion, again using 10cc sterile water to inflate the balloon.   The patient was awoken from anesthesia and taken to the recovery area in stable condition.  All sponge and needle counts were correct x2.    Post Op Plan:   1. Admit to urology service for overnight observation 2. Will remove foley catheter tomorrow 3. Suprapubic catheter will be exchanged in 4-6 weeks over a wire in the office. 4. Will continue on antibiotics while admitted   Attestation:  Dr. Annabell Howells was present and scrubbed for the entirety of the procedure.    Callie Fielding, MD Resident Physician Department of Urology

## 2017-07-11 NOTE — Interval H&P Note (Signed)
History and Physical Interval Note:  07/11/2017 8:17 AM  Tara Whitehead  has presented today for surgery, with the diagnosis of NEUROGENIC BLADDER  The various methods of treatment have been discussed with the patient and family. After consideration of risks, benefits and other options for treatment, the patient has consented to  Procedure(s): BOTOX INJECTION (N/A) INSERTION OF SUPRAPUBIC CATHETER (N/A) as a surgical intervention .  The patient's history has been reviewed, patient examined, no change in status, stable for surgery.  I have reviewed the patient's chart and labs.  Questions were answered to the patient's satisfaction.     Bjorn PippinJohn Zakiyah Whitehead

## 2017-07-11 NOTE — Addendum Note (Signed)
Addendum  created 07/11/17 1240 by Donna Bernardrimble, Kobie Matkins H, CRNA   Intraprocedure Flowsheets edited

## 2017-07-11 NOTE — Anesthesia Procedure Notes (Signed)
Procedure Name: Intubation Date/Time: 07/11/2017 9:26 AM Performed by: Donna Bernardrimble, Delrick Dehart H, CRNA Pre-anesthesia Checklist: Patient identified, Emergency Drugs available, Suction available, Patient being monitored and Timeout performed Patient Re-evaluated:Patient Re-evaluated prior to induction Oxygen Delivery Method: Circle system utilized Preoxygenation: Pre-oxygenation with 100% oxygen Induction Type: IV induction Ventilation: Mask ventilation without difficulty Laryngoscope Size: Miller and 2 Grade View: Grade III Tube type: Oral Tube size: 7.0 mm Number of attempts: 3 Airway Equipment and Method: Stylet Placement Confirmation: positive ETCO2,  ETT inserted through vocal cords under direct vision,  CO2 detector and breath sounds checked- equal and bilateral Secured at: 24 cm Tube secured with: Tape Dental Injury: Teeth and Oropharynx as per pre-operative assessment and Injury to lip  Comments: Small injury to right lower lip.  Minimal bleeding now subsided.

## 2017-07-11 NOTE — Care Management Note (Signed)
Case Management Note  Patient Details  Name: Tara JabsSharon P Papania MRN: [id number] Date of Birth: 1961/02/01  Subjective/Objective: 57 y/o f admitted w/neurogenic bladder. From home.s/p cystourethroscopy, suprapubic catheter.                   Action/Plan:d/c plan home.   Expected Discharge Date:                  Expected Discharge Plan:  Home/Self Care  In-House Referral:     Discharge planning Services  CM Consult  Post Acute Care Choice:    Choice offered to:     DME Arranged:    DME Agency:     HH Arranged:    HH Agency:     Status of Service:  In process, will continue to follow  If discussed at Long Length of Stay Meetings, dates discussed:    Additional Comments:  Lanier ClamMahabir, Flor Houdeshell, RN 07/11/2017, 3:03 PM

## 2017-07-11 NOTE — Anesthesia Postprocedure Evaluation (Signed)
Anesthesia Post Note  Patient: Tara Whitehead  Procedure(s) Performed: BOTOX INJECTION (N/A ) INSERTION OF SUPRAPUBIC CATHETER (N/A )     Patient location during evaluation: PACU Anesthesia Type: General Level of consciousness: sedated Pain management: pain level controlled Vital Signs Assessment: post-procedure vital signs reviewed and stable Respiratory status: spontaneous breathing and respiratory function stable Cardiovascular status: stable Postop Assessment: no apparent nausea or vomiting Anesthetic complications: no    Last Vitals:  Vitals:   07/11/17 1156 07/11/17 1200  BP:  139/87  Pulse: 70 79  Resp: 18 (!) 25  Temp:    SpO2: 94% 93%    Last Pain:  Vitals:   07/11/17 0732  TempSrc: Oral                 Marrion Finan DANIEL

## 2017-07-11 NOTE — Transfer of Care (Signed)
Immediate Anesthesia Transfer of Care Note  Patient: Tara JabsSharon P Lafalce  Procedure(s) Performed: BOTOX INJECTION (N/A ) INSERTION OF SUPRAPUBIC CATHETER (N/A )  Patient Location: PACU  Anesthesia Type:General  Level of Consciousness: alert  and sedated  Airway & Oxygen Therapy: Patient connected to face mask oxygen  Post-op Assessment: Post -op Vital signs reviewed and stable  Post vital signs: stable  Last Vitals:  Vitals:   07/11/17 0800 07/11/17 1037  BP: (!) 198/107 (!) (P) 145/97  Pulse:    Resp:    Temp:  (P) 36.4 C  SpO2:  (P) 95%    Last Pain:  Vitals:   07/11/17 0732  TempSrc: Oral         Complications: No apparent anesthesia complications

## 2017-07-12 ENCOUNTER — Observation Stay (HOSPITAL_BASED_OUTPATIENT_CLINIC_OR_DEPARTMENT_OTHER): Payer: PPO

## 2017-07-12 ENCOUNTER — Encounter (HOSPITAL_COMMUNITY): Payer: Self-pay | Admitting: Urology

## 2017-07-12 ENCOUNTER — Observation Stay (HOSPITAL_COMMUNITY): Payer: PPO

## 2017-07-12 DIAGNOSIS — Z993 Dependence on wheelchair: Secondary | ICD-10-CM | POA: Diagnosis not present

## 2017-07-12 DIAGNOSIS — G8194 Hemiplegia, unspecified affecting left nondominant side: Secondary | ICD-10-CM | POA: Diagnosis present

## 2017-07-12 DIAGNOSIS — Z66 Do not resuscitate: Secondary | ICD-10-CM | POA: Diagnosis not present

## 2017-07-12 DIAGNOSIS — R0902 Hypoxemia: Secondary | ICD-10-CM | POA: Diagnosis not present

## 2017-07-12 DIAGNOSIS — L8990 Pressure ulcer of unspecified site, unspecified stage: Secondary | ICD-10-CM

## 2017-07-12 DIAGNOSIS — J90 Pleural effusion, not elsewhere classified: Secondary | ICD-10-CM | POA: Diagnosis not present

## 2017-07-12 DIAGNOSIS — N319 Neuromuscular dysfunction of bladder, unspecified: Secondary | ICD-10-CM | POA: Diagnosis present

## 2017-07-12 DIAGNOSIS — Z91048 Other nonmedicinal substance allergy status: Secondary | ICD-10-CM | POA: Diagnosis not present

## 2017-07-12 DIAGNOSIS — K5900 Constipation, unspecified: Secondary | ICD-10-CM | POA: Diagnosis present

## 2017-07-12 DIAGNOSIS — R0602 Shortness of breath: Secondary | ICD-10-CM

## 2017-07-12 DIAGNOSIS — J189 Pneumonia, unspecified organism: Secondary | ICD-10-CM | POA: Diagnosis present

## 2017-07-12 DIAGNOSIS — Y95 Nosocomial condition: Secondary | ICD-10-CM | POA: Diagnosis present

## 2017-07-12 DIAGNOSIS — G1221 Amyotrophic lateral sclerosis: Secondary | ICD-10-CM | POA: Diagnosis present

## 2017-07-12 DIAGNOSIS — R339 Retention of urine, unspecified: Secondary | ICD-10-CM | POA: Diagnosis present

## 2017-07-12 DIAGNOSIS — E876 Hypokalemia: Secondary | ICD-10-CM | POA: Diagnosis present

## 2017-07-12 DIAGNOSIS — Z6841 Body Mass Index (BMI) 40.0 and over, adult: Secondary | ICD-10-CM | POA: Diagnosis not present

## 2017-07-12 DIAGNOSIS — F17210 Nicotine dependence, cigarettes, uncomplicated: Secondary | ICD-10-CM | POA: Diagnosis present

## 2017-07-12 DIAGNOSIS — J9621 Acute and chronic respiratory failure with hypoxia: Secondary | ICD-10-CM | POA: Diagnosis present

## 2017-07-12 DIAGNOSIS — I5030 Unspecified diastolic (congestive) heart failure: Secondary | ICD-10-CM | POA: Diagnosis not present

## 2017-07-12 DIAGNOSIS — L89151 Pressure ulcer of sacral region, stage 1: Secondary | ICD-10-CM | POA: Diagnosis present

## 2017-07-12 DIAGNOSIS — G8250 Quadriplegia, unspecified: Secondary | ICD-10-CM | POA: Diagnosis present

## 2017-07-12 DIAGNOSIS — Q631 Lobulated, fused and horseshoe kidney: Secondary | ICD-10-CM | POA: Diagnosis not present

## 2017-07-12 DIAGNOSIS — N3941 Urge incontinence: Secondary | ICD-10-CM | POA: Diagnosis present

## 2017-07-12 DIAGNOSIS — Z7189 Other specified counseling: Secondary | ICD-10-CM | POA: Diagnosis not present

## 2017-07-12 DIAGNOSIS — I10 Essential (primary) hypertension: Secondary | ICD-10-CM | POA: Diagnosis present

## 2017-07-12 DIAGNOSIS — G4730 Sleep apnea, unspecified: Secondary | ICD-10-CM | POA: Diagnosis present

## 2017-07-12 DIAGNOSIS — R000 Tachycardia, unspecified: Secondary | ICD-10-CM | POA: Diagnosis present

## 2017-07-12 DIAGNOSIS — Z515 Encounter for palliative care: Secondary | ICD-10-CM | POA: Diagnosis not present

## 2017-07-12 DIAGNOSIS — E441 Mild protein-calorie malnutrition: Secondary | ICD-10-CM | POA: Diagnosis present

## 2017-07-12 DIAGNOSIS — F418 Other specified anxiety disorders: Secondary | ICD-10-CM | POA: Diagnosis present

## 2017-07-12 DIAGNOSIS — J9601 Acute respiratory failure with hypoxia: Secondary | ICD-10-CM | POA: Diagnosis not present

## 2017-07-12 DIAGNOSIS — J9811 Atelectasis: Secondary | ICD-10-CM | POA: Diagnosis present

## 2017-07-12 LAB — ECHOCARDIOGRAM COMPLETE
HEIGHTINCHES: 71 in
WEIGHTICAEL: 4160 [oz_av]

## 2017-07-12 LAB — BLOOD GAS, ARTERIAL
Acid-Base Excess: 6 mmol/L — ABNORMAL HIGH (ref 0.0–2.0)
BICARBONATE: 30.6 mmol/L — AB (ref 20.0–28.0)
DELIVERY SYSTEMS: POSITIVE
DRAWN BY: 103701
Expiratory PAP: 6
FIO2: 100
Inspiratory PAP: 12
LHR: 10 {breaths}/min
O2 Saturation: 91.7 %
Patient temperature: 98.6
pCO2 arterial: 44.8 mmHg (ref 32.0–48.0)
pH, Arterial: 7.449 (ref 7.350–7.450)
pO2, Arterial: 61.8 mmHg — ABNORMAL LOW (ref 83.0–108.0)

## 2017-07-12 LAB — BASIC METABOLIC PANEL
ANION GAP: 8 (ref 5–15)
BUN: 15 mg/dL (ref 6–20)
CALCIUM: 8.7 mg/dL — AB (ref 8.9–10.3)
CO2: 30 mmol/L (ref 22–32)
Chloride: 103 mmol/L (ref 101–111)
Glucose, Bld: 109 mg/dL — ABNORMAL HIGH (ref 65–99)
Potassium: 4.3 mmol/L (ref 3.5–5.1)
SODIUM: 141 mmol/L (ref 135–145)

## 2017-07-12 LAB — PROCALCITONIN

## 2017-07-12 LAB — PROTIME-INR
INR: 0.93
PROTHROMBIN TIME: 12.4 s (ref 11.4–15.2)

## 2017-07-12 LAB — COMPREHENSIVE METABOLIC PANEL
ALBUMIN: 3.8 g/dL (ref 3.5–5.0)
ALK PHOS: 95 U/L (ref 38–126)
ALT: 22 U/L (ref 14–54)
AST: 19 U/L (ref 15–41)
Anion gap: 10 (ref 5–15)
BILIRUBIN TOTAL: 0.5 mg/dL (ref 0.3–1.2)
BUN: 13 mg/dL (ref 6–20)
CO2: 27 mmol/L (ref 22–32)
Calcium: 8.9 mg/dL (ref 8.9–10.3)
Chloride: 102 mmol/L (ref 101–111)
Creatinine, Ser: <0.3 mg/dL — ABNORMAL LOW (ref 0.44–1.00)
GLUCOSE: 116 mg/dL — AB (ref 65–99)
POTASSIUM: 3.4 mmol/L — AB (ref 3.5–5.1)
SODIUM: 139 mmol/L (ref 135–145)
TOTAL PROTEIN: 7.7 g/dL (ref 6.5–8.1)

## 2017-07-12 LAB — MRSA PCR SCREENING: MRSA by PCR: NEGATIVE

## 2017-07-12 LAB — APTT: APTT: 26 s (ref 24–36)

## 2017-07-12 LAB — TROPONIN I
Troponin I: <0.03 ng/mL (ref <0.03)
Troponin I: <0.03 ng/mL (ref <0.03)

## 2017-07-12 LAB — CBC
HEMATOCRIT: 49.3 % — AB (ref 36.0–46.0)
Hemoglobin: 16.9 g/dL — ABNORMAL HIGH (ref 12.0–15.0)
MCH: 29.7 pg (ref 26.0–34.0)
MCHC: 34.3 g/dL (ref 30.0–36.0)
MCV: 86.6 fL (ref 78.0–100.0)
PLATELETS: 257 10*3/uL (ref 150–400)
RBC: 5.69 MIL/uL — AB (ref 3.87–5.11)
RDW: 15.3 % (ref 11.5–15.5)
WBC: 28.6 10*3/uL — AB (ref 4.0–10.5)

## 2017-07-12 LAB — HEMOGLOBIN AND HEMATOCRIT, BLOOD
HEMATOCRIT: 45.5 % (ref 36.0–46.0)
HEMOGLOBIN: 15.1 g/dL — AB (ref 12.0–15.0)

## 2017-07-12 LAB — BRAIN NATRIURETIC PEPTIDE: B Natriuretic Peptide: 39.5 pg/mL (ref 0.0–100.0)

## 2017-07-12 LAB — LACTIC ACID, PLASMA
Lactic Acid, Venous: 1.1 mmol/L (ref 0.5–1.9)
Lactic Acid, Venous: 1.7 mmol/L (ref 0.5–1.9)

## 2017-07-12 MED ORDER — LABETALOL HCL 5 MG/ML IV SOLN
10.0000 mg | INTRAVENOUS | Status: DC | PRN
Start: 2017-07-12 — End: 2017-07-16
  Administered 2017-07-14 – 2017-07-16 (×6): 10 mg via INTRAVENOUS
  Filled 2017-07-12 (×7): qty 4

## 2017-07-12 MED ORDER — VANCOMYCIN HCL 10 G IV SOLR
2000.0000 mg | Freq: Once | INTRAVENOUS | Status: AC
  Administered 2017-07-12: 2000 mg via INTRAVENOUS
  Filled 2017-07-12: qty 2000

## 2017-07-12 MED ORDER — PREDNISONE 5 MG (21) PO TBPK: 10.0000 mg | ORAL_TABLET | Freq: Every evening | ORAL | Status: DC

## 2017-07-12 MED ORDER — ALBUTEROL SULFATE (2.5 MG/3ML) 0.083% IN NEBU: 2.5000 mg/h | INHALATION_SOLUTION | Freq: Four times a day (QID) | RESPIRATORY_TRACT | Status: DC | PRN

## 2017-07-12 MED ORDER — DOXYCYCLINE HYCLATE 100 MG PO TABS
100.0000 mg | ORAL_TABLET | Freq: Two times a day (BID) | ORAL | Status: DC
  Administered 2017-07-12: 100 mg via ORAL
  Filled 2017-07-12: qty 1

## 2017-07-12 MED ORDER — VANCOMYCIN HCL IN DEXTROSE 750-5 MG/150ML-% IV SOLN
750.0000 mg | Freq: Two times a day (BID) | INTRAVENOUS | Status: DC
  Administered 2017-07-12 – 2017-07-14 (×4): 750 mg via INTRAVENOUS
  Filled 2017-07-12 (×5): qty 150

## 2017-07-12 MED ORDER — FUROSEMIDE 10 MG/ML IJ SOLN: 20.0000 mg | Freq: Once | INTRAMUSCULAR | Status: DC

## 2017-07-12 MED ORDER — PREDNISONE 5 MG (21) PO TBPK: 5.0000 mg | ORAL_TABLET | Freq: Four times a day (QID) | ORAL | Status: DC

## 2017-07-12 MED ORDER — HYDRALAZINE HCL 20 MG/ML IJ SOLN
10.0000 mg | INTRAMUSCULAR | Status: DC | PRN
Start: 2017-07-12 — End: 2017-07-13
  Administered 2017-07-12 (×2): 10 mg via INTRAVENOUS
  Filled 2017-07-12 (×2): qty 1

## 2017-07-12 MED ORDER — HYDRALAZINE HCL 25 MG PO TABS: 25.0000 mg | ORAL_TABLET | Freq: Four times a day (QID) | ORAL | Status: DC | PRN

## 2017-07-12 MED ORDER — AMLODIPINE BESYLATE 10 MG PO TABS
10.0000 mg | ORAL_TABLET | Freq: Every day | ORAL | Status: DC
  Administered 2017-07-12 – 2017-07-14 (×2): 10 mg via ORAL
  Filled 2017-07-12 (×2): qty 1

## 2017-07-12 MED ORDER — PREDNISONE 5 MG (21) PO TBPK: 5.0000 mg | ORAL_TABLET | ORAL | Status: DC

## 2017-07-12 MED ORDER — ALBUTEROL SULFATE (2.5 MG/3ML) 0.083% IN NEBU
2.5000 mg | INHALATION_SOLUTION | RESPIRATORY_TRACT | Status: DC | PRN
  Administered 2017-07-12: 2.5 mg via RESPIRATORY_TRACT
  Filled 2017-07-12: qty 3

## 2017-07-12 MED ORDER — DEXTROSE 5 % IV SOLN
1.0000 g | Freq: Three times a day (TID) | INTRAVENOUS | Status: AC
  Administered 2017-07-12 – 2017-07-22 (×30): 1 g via INTRAVENOUS
  Filled 2017-07-12 (×32): qty 1

## 2017-07-12 MED ORDER — FUROSEMIDE 10 MG/ML IJ SOLN
INTRAMUSCULAR | Status: AC
  Filled 2017-07-12: qty 4

## 2017-07-12 MED ORDER — FUROSEMIDE 10 MG/ML IJ SOLN
40.0000 mg | INTRAMUSCULAR | Status: AC
  Administered 2017-07-12: 40 mg via INTRAVENOUS

## 2017-07-12 MED ORDER — PREDNISONE 5 MG (21) PO TBPK
10.0000 mg | ORAL_TABLET | Freq: Every morning | ORAL | Status: DC
  Filled 2017-07-12: qty 21

## 2017-07-12 MED ORDER — DEXTROSE 5 % IV SOLN
2.0000 g | Freq: Once | INTRAVENOUS | Status: AC
  Administered 2017-07-12: 2 g via INTRAVENOUS
  Filled 2017-07-12: qty 2

## 2017-07-12 MED ORDER — PREDNISONE 5 MG (21) PO TBPK: 5.0000 mg | ORAL_TABLET | Freq: Three times a day (TID) | ORAL | Status: DC

## 2017-07-12 NOTE — Progress Notes (Signed)
Urology Progress Note   1 Day Post-Op s/p cystourethroscopy, botox instillation, SPT placement  Subjective: Increased WOB this AM. Has been doing IS all night, coughing and taking deep breaths but not improving. HTN this AM, SBP > 190. SPT and foley catheter draining clear yellow urine. Afebrile.   Objective: Vital signs in last 24 hours: Temp:  [97.3 F (36.3 C)-98.3 F (36.8 C)] 97.6 F (36.4 C) (01/18 0538) Pulse Rate:  [67-107] 87 (01/18 0538) Resp:  [18-28] 21 (01/18 0538) BP: (122-198)/(68-107) 172/75 (01/18 0712) SpO2:  [91 %-96 %] 94 % (01/18 0538)  Intake/Output from previous day: 01/17 0701 - 01/18 0700 In: 1900 [P.O.:400; I.V.:1400; IV Piggyback:100] Out: 3500 [Urine:3450; Blood:50] Intake/Output this shift: No intake/output data recorded.  Physical Exam:  General: Alert and oriented CV: RRR Lungs: Clear Abdomen: Soft, non distended. Incisions clean dry and intact with SPT in place. Draining clear yellow urine. Foley catheter draining clear yellow urine.  Ext: NT, No erythema  Lab Results: Recent Labs    07/11/17 0730 07/12/17 0526  HGB 17.1* 15.1*  HCT 50.4* 45.5   BMET Recent Labs    07/11/17 0730 07/12/17 0526  NA 142 141  K 4.9 4.3  CL 102 103  CO2 33* 30  GLUCOSE 103* 109*  BUN 17 15  CREATININE <0.30* <0.30*  CALCIUM 9.4 8.7*     Studies/Results: No results found.  Assessment/Plan:  57 y.o. female w/ history of multiple sclerosis, POD1 s/p intravesical botox injection and suprapubic tube placement. Some increased work of breathing and HTN this AM. From urologic perpesctive, likely could go home today. Will have hospitalist service evaluate patient to see if reasonable for discharge. Will remove foley catheter, keep bladder draining to SPT.       LOS: 0 days   FILIPPOU, PAULINE L 07/12/2017, 7:28 AM

## 2017-07-12 NOTE — Progress Notes (Signed)
Rt had to place pt on BIPAP on 4E21 due to WOB and SOB.

## 2017-07-12 NOTE — Progress Notes (Signed)
  Echocardiogram 2D Echocardiogram has been performed.  Leta JunglingCooper, Oliviarose Punch M 07/12/2017, 3:01 PM

## 2017-07-12 NOTE — H&P (Signed)
Medical Consultation   Tara Whitehead  ZOX:[id number]  DOB: [birth year]  DOA: 07/11/2017  PCP: Philemon Kingdom, MD  Outpatient Specialists:   Requesting physician: Dr Annabell Howells, Urology   Reason for consultation: Shortness of Breadth and Hypertension  History of Present Illness: Tara Whitehead is an 57 y.o. female 57 y.o. female  with medical history significant for not limited to morbid obesity, hypertension amyotrophic lateral sclerosis,  POD1 s/p intravesical botox injection and suprapubic tube placement.   We were asked to see her this morning allergy team on account of increased work of breathing and HTN this AM.   She had apparently been doing well until this morning when she was noted to be having some shortness of breath with increased work of breathing and was started on oxygen by nasal cannula and we are consulted for further management.  Patient notes that her sibling having problems with her breathing over the last 6 months and indeed her "ALS doctor" was planning to put her on BiPAP.  However 5 days ago, on Monday she started having some problems of cough loss of breath saw her PCP who started her on steroid tablets with Z-Pak which is about completed.  She admits to chills without fever and had actually been feeling better without any respiratory distress at time of my evaluation after initiation of supplemental oxygen and able to complete full sentences.  She admits to left-sided mild aching chest pain which is nonradiating lasting for a few minutes this morning about 3-4/10.  Her shortness of breath is better and denies any dizziness but admits to being fatigued a little bit this morning.  She denies any palpitations or diaphoresis, no paroxysmal nocturnal dyspnea, and no dizziness.  Review of Systems:  ROS As per HPI otherwise 10 point review of systems negative.     Past Medical History: Past Medical History:  Diagnosis Date  . ALS (amyotrophic lateral  sclerosis) (HCC) 11/23/2016  . Anxiety   . Arthritis   . Complication of anesthesia    unable to do spinal at one of surgeries   . Depression   . Dyspnea    due to ALS   . Hypertension   . Neuromuscular disorder (HCC)    ALS     Past Surgical History: Past Surgical History:  Procedure Laterality Date  . APPENDECTOMY    . BILATERAL CARPAL TUNNEL RELEASE    . BOTOX INJECTION N/A 07/11/2017   Procedure: BOTOX INJECTION;  Surgeon: Bjorn Pippin, MD;  Location: [facility];  Service: Urology;  Laterality: N/A;  . INSERTION OF SUPRAPUBIC CATHETER N/A 07/11/2017   Procedure: INSERTION OF SUPRAPUBIC CATHETER;  Surgeon: Bjorn Pippin, MD;  Location: [facility];  Service: Urology;  Laterality: N/A;  . JOINT REPLACEMENT     3 knee replacements     Allergies:   Allergies  Allergen Reactions  . Chlorhexidine Gluconate Itching and Rash    CHG WIPES---HIBICLENS WIPES     Social History:  reports that she has been smoking cigarettes.  She has been smoking about 1.00 pack per day. she has never used smokeless tobacco. She reports that she does not drink alcohol or use drugs.   Family History: History reviewed. No pertinent family history.  Physical Exam: Vitals:   07/12/17 0538 07/12/17 0703 07/12/17 0712 07/12/17 0716  BP: (!) 189/93 (!) 194/88 (!) 172/75   Pulse: 87     Resp: Marland Kitchen)  21     Temp: 97.6 F (36.4 C)     TempSrc: Oral     SpO2: 94%   90%  Weight:      Height:        General: Resting comfortably on oxygen by nasal cannula, no acute distress time of my evaluation Eyes: PERLA, EOMI, anicteric sclera,  ENMT: external ears and nose appear normal, mucous membranes moist  neck: Supple, no JVD  CVS: S1-S2 clear, no murmur rubs or gallops, trace bipedal LE edema, normal pedal pulses  Respiratory: Bilateral wheezing with diminished breath sounds at the bases, left more than right  abdomen: Obese, soft nontender, nondistended, normal bowel sounds, no hepatosplenomegaly,    Musculoskeletal: : no cyanosis, clubbing  Neuro: Alert oriented x3 no acute focal deficits Psych: judgement and insight appear normal, stable mood and affect, mental status      Data reviewed:  I have personally reviewed following labs and imaging studies Labs:  CBC: Recent Labs  Lab 07/11/17 0730 07/12/17 0526  WBC 12.7*  --   HGB 17.1* 15.1*  HCT 50.4* 45.5  MCV 86.4  --   PLT 259  --     Basic Metabolic Panel: Recent Labs  Lab 07/11/17 0730 07/12/17 0526  NA 142 141  K 4.9 4.3  CL 102 103  CO2 33* 30  GLUCOSE 103* 109*  BUN 17 15  CREATININE <0.30* <0.30*  CALCIUM 9.4 8.7*   GFR CrCl cannot be calculated (This lab value cannot be used to calculate CrCl because it is not a number: <0.30). Liver Function Tests: No results for input(s): AST, ALT, ALKPHOS, BILITOT, PROT, ALBUMIN in the last 168 hours. No results for input(s): LIPASE, AMYLASE in the last 168 hours. No results for input(s): AMMONIA in the last 168 hours. Coagulation profile No results for input(s): INR, PROTIME in the last 168 hours.  Cardiac Enzymes: No results for input(s): CKTOTAL, CKMB, CKMBINDEX, TROPONINI in the last 168 hours. BNP: Invalid input(s): POCBNP CBG: No results for input(s): GLUCAP in the last 168 hours. D-Dimer No results for input(s): DDIMER in the last 72 hours. Hgb A1c No results for input(s): HGBA1C in the last 72 hours. Lipid Profile No results for input(s): CHOL, HDL, LDLCALC, TRIG, CHOLHDL, LDLDIRECT in the last 72 hours. Thyroid function studies No results for input(s): TSH, T4TOTAL, T3FREE, THYROIDAB in the last 72 hours.  Invalid input(s): FREET3 Anemia work up No results for input(s): VITAMINB12, FOLATE, FERRITIN, TIBC, IRON, RETICCTPCT in the last 72 hours. Urinalysis No results found for: COLORURINE, APPEARANCEUR, LABSPEC, PHURINE, GLUCOSEU, HGBUR, BILIRUBINUR, KETONESUR, PROTEINUR, UROBILINOGEN, NITRITE, LEUKOCYTESUR   Sepsis Labs Invalid input(s):  PROCALCITONIN,  WBC,  LACTICIDVEN Microbiology No results found for this or any previous visit (from the past 240 hour(s)).     Inpatient Medications:   Scheduled Meds: . amLODipine  5 mg Oral Daily  . buPROPion  150 mg Oral Daily  . docusate sodium  100 mg Oral BID  . mouth rinse  15 mL Mouth Rinse BID  . oxybutynin  10 mg Oral Daily  . senna-docusate  2 tablet Oral QHS  . venlafaxine XR  225 mg Oral Q breakfast   Continuous Infusions:   Radiological Exams on Admission: No results found.  Impression/Recommendations Active Problems:   Neurogenic bladder disorder   Pressure injury of skin  #1 Dyspnea: Suspect due to Acute Bronchitis in the setting of ALS. Doubt acute CHF hypertensive heart disease -may need 2D echocardiogram. Antibiotic coverage, steroid, Nebs,  12 Lead EKG, CXR, cardiac enzymes.  May need BiPAP initiation at night prior to discharge No clinical evidence of PE.  #2 Hypertension: Uncontrolled. Increase amlodipine dose to 10 mg daily, with prn low dose hydralazine for now, adjust as needed     Thank you for this consultation.  [facility] will follow the patient with you.   Time Spent: 36 mins  OSEI-BONSU,Esme Freund M.D. Pager # [phone] [facility] 07/12/2017, 9:35 AM

## 2017-07-12 NOTE — Progress Notes (Signed)
Rapid Response Event Note  Overview: called to room for SOB and increased WOB      Initial Focused Assessment: When arrived in room pt on NRB, oxygen sats 85% HR ST 130's. Increased WOB with RR in the 30's. MD at bedside. Patient Alert and oriented X3.    Interventions: Per MD order, Lasix given, pt placed on bipap by RT and transferred to ICU/SD for further treatment and monitoring.   Plan of Care (if not transferred): tx to room 1236.    Charlsie QuestBull, Dilyn Smiles B

## 2017-07-12 NOTE — Progress Notes (Addendum)
Pt very lethargic, currently on 100% 02 continuous bipap. O2 Sat 92%. HR 130s. Spoke with Pts spouse regarding possibility of intubation.  Pt's husband said he would like to speak with the Pt & Pts family in more detail, but spouse is currently undecided to allow MV if intubation becomes neccesary

## 2017-07-12 NOTE — Progress Notes (Signed)
Pt is asleep on bipap at 100% fio2, spo2 92%.  Cpt held at this time.

## 2017-07-12 NOTE — Progress Notes (Signed)
Pharmacy Antibiotic Note  Tara Whitehead is a 57 y.o. female admitted on 07/11/2017 with sepsis.  Pharmacy has been consulted for vancomycin and cefepime dosing. Patient developed respiratory distress 1/18 s/p cystourethroscopy 1/17 for botox instillation and insertion of suprapubic tube  Today, 07/12/2017  Renal: SCr < 0.3 (SCr will OVERestimate actual CrCl d/t ALS and dec muscle mass)  WBC mildly elevated 1/17  Plan:  Vancomycin 2gm IVPB x 1 then 750mg  IV q12h  Vancomycin levels will be needed once at steady state if remains on vancomycin > 3 days  Cefepime 1gm IV q8h  Daily SCr due to ALS and need to closely monitor change in SCr while on vancomycin  Height: 5\' 11"  (180.3 cm) Weight: 260 lb (117.9 kg) IBW/kg (Calculated) : 70.8  Temp (24hrs), Avg:97.7 F (36.5 C), Min:97.3 F (36.3 C), Max:98.2 F (36.8 C)  Recent Labs  Lab 07/11/17 0730 07/12/17 0526  WBC 12.7*  --   CREATININE <0.30* <0.30*    CrCl cannot be calculated (This lab value cannot be used to calculate CrCl because it is not a number: <0.30).    Allergies  Allergen Reactions  . Chlorhexidine Gluconate Itching and Rash    CHG WIPES---HIBICLENS WIPES    Antimicrobials this admission: 1/18 vanco >> 1/18 cefepime >>  Dose adjustments this admission:  Microbiology results: 1/18 BCx: 1/18 MRSA PCR  Thank you for allowing pharmacy to be a part of this patient's care.  Juliette Alcideustin Emalyn Schou, PharmD, BCPS.   Pager: [phone] 07/12/2017 12:11 PM

## 2017-07-12 NOTE — Consult Note (Addendum)
PULMONARY / CRITICAL CARE MEDICINE   Name: Tara Whitehead MRN: [id number] DOB: 1961-01-13    ADMISSION DATE:  07/11/2017 CONSULTATION DATE:  07/12/17  REFERRING MD:  Drs Lowell Guitar, Dr Annabell Howells  CHIEF COMPLAINT:  Acute resp failure  HISTORY OF PRESENT ILLNESS:   57 year old woman with a history of ALS followed at [facility].  She has a history of dyspnea both at rest and with her ADLs.  She was under evaluation for nocturnal ventilation, Trilogy device based on office note from 06/26/17.  Also cough assistance device and suctioning at home.  She underwent cystoscopy for suprapubic catheter placement and Botox instillation to treat small, neurogenic bladder.  Postprocedural course has been complicated by dyspnea, hypoxemia, increased work of breathing and some left-sided chest discomfort.  She has been hypertensive post procedure.  1/18 she developed acute worsening of her dyspnea, placed on BiPAP and moved to the ICU.  Chest x-ray is rotated but shows significant left-sided volume loss, possible associated effusion, with elevated left hemidiaphragm (which is still visible).  She received empiric Lasix, was started on empiric broad-spectrum antibiotics prior to transfer.  Of note she was treated recently with a short course of antibiotics and a prednisone taper in the setting of an apparent upper respiratory infection, question whether this is evolved into pneumonia.  At the least it may have influenced her secretions and affected her secretion clearance, left-sided atelectasis   PAST MEDICAL HISTORY :  She  has a past medical history of ALS (amyotrophic lateral sclerosis) (HCC) (11/23/2016), Anxiety, Arthritis, Complication of anesthesia, Depression, Dyspnea, Hypertension, and Neuromuscular disorder (HCC).  PAST SURGICAL HISTORY: She  has a past surgical history that includes Joint replacement; Appendectomy; Bilateral carpal tunnel release; Botox injection (N/A, 07/11/2017); and Insertion of suprapubic  catheter (N/A, 07/11/2017).  Allergies  Allergen Reactions  . Chlorhexidine Gluconate Itching and Rash    CHG WIPES---HIBICLENS WIPES    No current facility-administered medications on file prior to encounter.    Current Outpatient Medications on File Prior to Encounter  Medication Sig  . amLODipine (NORVASC) 5 MG tablet Take 5 mg by mouth daily.  Marland Kitchen buPROPion (WELLBUTRIN XL) 150 MG 24 hr tablet Take 150 mg by mouth daily.  . clotrimazole (LOTRIMIN) 1 % cream Apply 1 application topically 2 (two) times daily as needed (for itching in groin area mix triamcinolone with clotrimazole 1:1).  Boris Lown Oil 1000 MG CAPS Take 1,000 mg by mouth daily.  . meloxicam (MOBIC) 7.5 MG tablet Take 15 mg by mouth daily.  . Methylcobalamin (METHYL B-12 PO) Take 1 tablet by mouth daily.  . Nutritional Supplements (JUICE PLUS FIBRE PO) Take 6 tablets by mouth daily. Fruit/Vegetable/Berry Blend Capsules Take 2 capsule of each variety in the morning.  Marland Kitchen oxybutynin (DITROPAN-XL) 10 MG 24 hr tablet Take 10 mg by mouth daily.  . Probiotic Product (PROBIOTIC PO) Take 1 capsule by mouth daily. 80 Billion  . triamcinolone cream (KENALOG) 0.1 % Apply 1 application topically 2 (two) times daily as needed (for itching in groin area mix triamcinolone with clotrimazole 1:1).  . venlafaxine XR (EFFEXOR-XR) 75 MG 24 hr capsule Take 225 mg by mouth daily with breakfast.  . ibuprofen (ADVIL,MOTRIN) 200 MG tablet Take 400 mg by mouth every 8 (eight) hours as needed (for pain.).    FAMILY HISTORY:  Her has no family status information on file.    SOCIAL HISTORY: She  reports that she has been smoking cigarettes.  She has been smoking about 1.00  pack per day. she has never used smokeless tobacco. She reports that she does not drink alcohol or use drugs.  REVIEW OF SYSTEMS:   Dyspnea, some left-sided chest discomfort  SUBJECTIVE:  Remains short of breath but improved now that she is wearing BiPAP.  VITAL SIGNS: BP (!)  202/91   Pulse (!) 133   Temp 98.2 F (36.8 C) (Oral)   Resp (!) 32   Ht 5\' 11"  (1.803 m)   Wt 117.9 kg (260 lb)   SpO2 (!) 87%   BMI 36.26 kg/m   HEMODYNAMICS:    VENTILATOR SETTINGS: FiO2 (%):  [70 %-100 %] 100 %  INTAKE / OUTPUT: I/O last 3 completed shifts: In: 1900 [P.O.:400; I.V.:1400; IV Piggyback:100] Out: 3500 [Urine:3450; Blood:50]  PHYSICAL EXAMINATION: General: Obese somewhat ill-appearing woman wearing BiPAP Neuro: Awake, interacting, answering all questions appropriately.  Marginal cough.  Some mild left-sided weakness, right side intact. HEENT: Oropharynx appears moist, BiPAP in place Cardiovascular: Tachycardic, hyperdynamic, no murmur Lungs: Right side is clear, left side is very diminished with barely any breath sounds Abdomen: Obese, soft, nontender, + BS GU: Suprapubic catheter appears to be in its intended position, sutures in place.  There is no surrounding erythema Musculoskeletal: No deformities Skin: No apparent rash or skin breakdown  LABS:  BMET Recent Labs  Lab 07/11/17 0730 07/12/17 0526 07/12/17 1158  NA 142 141 139  K 4.9 4.3 3.4*  CL 102 103 102  CO2 33* 30 27  BUN 17 15 13   CREATININE <0.30* <0.30* <0.30*  GLUCOSE 103* 109* 116*    Electrolytes Recent Labs  Lab 07/11/17 0730 07/12/17 0526 07/12/17 1158  CALCIUM 9.4 8.7* 8.9    CBC Recent Labs  Lab 07/11/17 0730 07/12/17 0526 07/12/17 1158  WBC 12.7*  --  28.6*  HGB 17.1* 15.1* 16.9*  HCT 50.4* 45.5 49.3*  PLT 259  --  257    Coag's Recent Labs  Lab 07/12/17 1158  APTT 26  INR 0.93    Sepsis Markers Recent Labs  Lab 07/12/17 1158  LATICACIDVEN 1.1    ABG No results for input(s): PHART, PCO2ART, PO2ART in the last 168 hours.  Liver Enzymes Recent Labs  Lab 07/12/17 1158  AST 19  ALT 22  ALKPHOS 95  BILITOT 0.5  ALBUMIN 3.8    Cardiac Enzymes Recent Labs  Lab 07/12/17 1000  TROPONINI <0.03    Glucose No results for input(s):  GLUCAP in the last 168 hours.  Imaging Dg Chest 1 View  Result Date: 07/12/2017 CLINICAL DATA:  Difficulty breathing. EXAM: CHEST 1 VIEW COMPARISON:  Chest x-ray 05/14/2008. FINDINGS: Large left pleural effusion noted. Decreased left lung volume suggesting with dense left base consistent with atelectasis. Underlying infiltrate cannot be excluded. Elevation left hemidiaphragm. No pneumothorax. Small right pleural effusion cannot be excluded. No pneumothorax. Heart size is difficult to assess due to opacity of the left chest. Degenerative change thoracic spine. IMPRESSION: 1.  Left lower lobe atelectasis and left pleural effusion. 2.  Small right pleural effusion cannot be excluded. Electronically Signed   By: Maisie Fushomas  Register   On: 07/12/2017 10:48     STUDIES: Chest x-ray 1/18 >> left lower lobe atelectasis versus infiltrate with possible associated effusion  CULTURES: Blood 1/18 >>   ANTIBIOTICS: Cefepime 1/18 >> Vancomycin 1/18 >>   SIGNIFICANT EVENTS: Cystoscopy, suprapubic catheter placement, Botox instillation into the bladder 1/18  LINES/TUBES:   DISCUSSION:  ASSESSMENT / PLAN:  PULMONARY A: Acute on chronic respiratory failure  due to ALS, possibly exacerbated by her recent procedure/sedation with resultant left-sided atelectasis.  Consider evolving left-sided pneumonia.  Consider effects of Botox instillation, although I believe this would be less likely.  She does have chronic left-sided weakness and may have chronically abnormal left hemidiaphragm excursion P:   Continue BiPAP for now, hopefully with pulmonary hygiene her left lower lobe will improve aeration and we will able to avoid mechanical ventilation Aggressive chest PT, chest vest Empiric antibiotics have been ordered as below Rule out cardiac event, troponins ordered.  Check EKG ABG after several hours on BiPAP to assess effectiveness No wheezing on exam.  I will stop the prednisone.  Do not want to have her on  any medications that would contribute to respiratory muscle weakness  CARDIOVASCULAR A:  Hypertension P:  Continue her current hydralazine, amlodipine And labetalol as needed is ordered  RENAL / UROLOGICAL A:   Hypokalemia Neurogenic bladder status post suprapubic catheter, Botox instillation 1/17 P:   Follow urine output, BMP, replace electrolytes as indicated Suprapubic catheter management per urology plans Remains on Ditropan  GASTROINTESTINAL A:   SUP Constipation P:   Add PPI NPO for now given risk for respiratory failure and while on BiPAP Continue senna  HEMATOLOGIC A:   Acute leukocytosis, suspect due to left-sided pneumonia P:  Follow CBC  INFECTIOUS A:   Left sided infiltrate, possible HCAP P:   Agree with empiric vancomycin, cefepime Follow blood cultures Obtain respiratory cultures if the patient is able to produce  ENDOCRINE A:   At risk hyperglycemia on corticosteroids P:   Sliding scale insulin per protocol  NEUROLOGIC A:   ALS, followed at [facility] Depression P:   RASS goal: 0 Wellbutrin and Effexor   FAMILY  - Updates: I updated the patient and her cousin at bedside 1/18  - Inter-disciplinary family meet or Palliative Care meeting due by:  07/19/16  Independent CC time 55 minutes  Levy Pupa, MD, PhD 07/12/2017, 1:51 PM [facility] [phone] or if no answer [phone]

## 2017-07-12 NOTE — Treatment Plan (Signed)
Called to bedside for rapid response.  PT with hx of ALS, HTN, here for suprapubic catheter placement for small capacity bladder with incontinence.  Here for botox and suprapubic catheter placed yesterday.  Today noted to have increased WOB.   S: Notes SOB, some chest discomfort.  O Vitals:   07/12/17 0716 07/12/17 1034  BP:  (!) 190/104  Pulse:  (!) 118  Resp:  (!) 24  Temp:  98.2 F (36.8 C)  SpO2: 90% (!) 88%   General: increased WOB Cardiovascular: Heart sounds show a tachycardic rate, and rhythm. No gallops or rubs. No murmurs. No JVD.  TTP reproduces CP Lungs: Decreased breath sounds at bases L>R, increased WOB.  Abdomen: suprapubic cath, no abdominal pain Neurological: Alert and oriented 3. L sided weakness, which she notes is chronic.  Skin: Warm and dry. No rashes or lesions. Extremities: No clubbing or cyanosis. No edema. Pedal pulses 2+. Psychiatric: Mood and affect are normal. Insight and judgment are appropriate.  A/P 57 yo with ALS with increased SOB after botox and suprapubic catheter placement.  CXR with LLL atelectasis and L pleural effusion.  EKG with sinus tach.  Notably hypertensive on exam.  Ddx includes volume overload, infection (she recently was started on abx as outpatient), adverse effect with botox, diaphragmatic paralysis (with L sided weakness with ALS).  S/p lasix, hydral.  Will obtain blood cx.  Broad spectrum abx.  May need thoracentesis.  Echo ordered.  Labetalol ordered prn for HTN as well.  ABG and lactate.  Has troponins pending.  Will discuss with consulting doctor.  Placed on bipap for hypoxia on NRB.  Discussed with critical care.

## 2017-07-13 ENCOUNTER — Inpatient Hospital Stay (HOSPITAL_COMMUNITY): Payer: PPO

## 2017-07-13 LAB — BASIC METABOLIC PANEL
Anion gap: 8 (ref 5–15)
BUN: 17 mg/dL (ref 6–20)
CALCIUM: 8.7 mg/dL — AB (ref 8.9–10.3)
CHLORIDE: 101 mmol/L (ref 101–111)
CO2: 27 mmol/L (ref 22–32)
GLUCOSE: 140 mg/dL — AB (ref 65–99)
Potassium: 3.4 mmol/L — ABNORMAL LOW (ref 3.5–5.1)
Sodium: 136 mmol/L (ref 135–145)

## 2017-07-13 LAB — MAGNESIUM: MAGNESIUM: 1.8 mg/dL (ref 1.7–2.4)

## 2017-07-13 LAB — BLOOD GAS, ARTERIAL
ACID-BASE EXCESS: 7 mmol/L — AB (ref 0.0–2.0)
BICARBONATE: 32.2 mmol/L — AB (ref 20.0–28.0)
DELIVERY SYSTEMS: POSITIVE
DRAWN BY: 270211
Expiratory PAP: 10
FIO2: 0.9
Inspiratory PAP: 15
MODE: POSITIVE
O2 SAT: 96.6 %
PCO2 ART: 48.2 mmHg — AB (ref 32.0–48.0)
Patient temperature: 98.6
pH, Arterial: 7.44 (ref 7.350–7.450)
pO2, Arterial: 91.5 mmHg (ref 83.0–108.0)

## 2017-07-13 LAB — CBC
HEMATOCRIT: 47.6 % — AB (ref 36.0–46.0)
HEMOGLOBIN: 16.3 g/dL — AB (ref 12.0–15.0)
MCH: 29.8 pg (ref 26.0–34.0)
MCHC: 34.2 g/dL (ref 30.0–36.0)
MCV: 87 fL (ref 78.0–100.0)
Platelets: 255 10*3/uL (ref 150–400)
RBC: 5.47 MIL/uL — ABNORMAL HIGH (ref 3.87–5.11)
RDW: 15.3 % (ref 11.5–15.5)
WBC: 38.9 10*3/uL — ABNORMAL HIGH (ref 4.0–10.5)

## 2017-07-13 LAB — PROCALCITONIN
PROCALCITONIN: 0.63 ng/mL
Procalcitonin: 0.54 ng/mL

## 2017-07-13 MED ORDER — POTASSIUM CHLORIDE 10 MEQ/100ML IV SOLN
10.0000 meq | INTRAVENOUS | Status: AC
  Administered 2017-07-13 (×3): 10 meq via INTRAVENOUS
  Filled 2017-07-13 (×3): qty 100

## 2017-07-13 MED ORDER — MAGNESIUM SULFATE 2 GM/50ML IV SOLN
2.0000 g | Freq: Once | INTRAVENOUS | Status: AC
  Administered 2017-07-13: 2 g via INTRAVENOUS
  Filled 2017-07-13: qty 50

## 2017-07-13 MED ORDER — FUROSEMIDE 10 MG/ML IJ SOLN
20.0000 mg | Freq: Once | INTRAMUSCULAR | Status: AC
  Administered 2017-07-13: 20 mg via INTRAVENOUS
  Filled 2017-07-13: qty 2

## 2017-07-13 MED ORDER — ENOXAPARIN SODIUM 40 MG/0.4ML ~~LOC~~ SOLN
40.0000 mg | SUBCUTANEOUS | Status: DC
  Administered 2017-07-13 – 2017-07-14 (×2): 40 mg via SUBCUTANEOUS
  Filled 2017-07-13 (×2): qty 0.4

## 2017-07-13 MED ORDER — ORAL CARE MOUTH RINSE
15.0000 mL | Freq: Two times a day (BID) | OROMUCOSAL | Status: DC
  Administered 2017-07-13 – 2017-07-21 (×13): 15 mL via OROMUCOSAL

## 2017-07-13 NOTE — Progress Notes (Signed)
PROGRESS NOTE    ZADAYA CUADRA  ZOX:[id number] DOB: 1960-07-20 DOA: 07/11/2017 PCP: Philemon Kingdom, MD   Brief Narrative:  NECIA KAMM is an 57 y.o. female 57 y.o.female with medical history significant for not limited to morbid obesity, hypertension amyotrophic lateral sclerosis, POD1 s/p intravesical botox injection and suprapubic tube placement.   We were asked to see her on 1/18 due to increased work of breathing and HTN this AM.   She had apparently been doing well until this 1/18 when she was noted to be having some shortness of breath with increased work of breathing and was started on oxygen by nasal cannula and we are consulted for further management.  Patient notes that her sibling having problems with her breathing over the last 6 months and indeed her "ALS doctor" was planning to put her on BiPAP.  However 5 days ago, on Monday she started having some problems of cough loss of breath saw her PCP who started her on steroid tablets with Z-Pak which is about completed.  She admits to chills without fever and had actually been feeling better without any respiratory distress at time of my evaluation after initiation of supplemental oxygen and able to complete full sentences.  She admits to left-sided mild aching chest pain which is nonradiating lasting for Naly Schwanz few minutes this morning about 3-4/10.  Her shortness of breath is better and denies any dizziness but admits to being fatigued Zaelyn Barbary little bit this morning.  She denies any palpitations or diaphoresis, no paroxysmal nocturnal dyspnea, and no dizziness.  Assessment & Plan:   Active Problems:   Neurogenic bladder disorder   Pressure injury of skin   SOB (shortness of breath)   Acute on chronic respiratory failure with hypoxia (HCC)  Acute Hypoxic Respiratory Failure:  Likely 2/2 HCAP.  WBC count rising.  CXR appears improved today, but persistent L>R opacity.  Effusion visualized yesterday not as notable today.  She was  notably Madisin Hasan difficult intubation.  With hx of ALS and reported L sided weakness, possibly some diaphragmatic weakness on that side, also effects of botox on differential, but with elevated WBC count, infectious most likely. Continue vanc/cefepime (1/18 - ) Chest PT, IS Procalcitonin Continue bipap, follow ABG, she's on 100% FiO2 right now, wean as tolerated Bcx pending Sputum cx  S/p lasix 40 mg yesterday, will dose 20 mg x1 today Negative troponins Echo with grade 1 diastolic dysfunction, normal EF  Hypertension: pt with PO amlodipine.  Prn labetalol ordered  Small capacity bladder with incontinence: s/p suprapubic catheter placement and botox by urology oyxbutinin  ALS: follows at [facility]  Depression: wellbutrin/effexor  Leukocytosis: worsening, likely 2/2 infection above  Hypokalemia: replete, follow mag  DVT prophylaxis: lovenox Code Status: full  Family Communication: husband at bedsdie Disposition Plan: pending improvement in resp status   Consultants:   PCCM  Procedures:  Echo 1/18 Study Conclusions  - Left ventricle: The cavity size was normal. There was severe   focal basal hypertrophy. Systolic function was normal. The   estimated ejection fraction was in the range of 60% to 65%. Wall   motion was normal; there were no regional wall motion   abnormalities. There was an increased relative contribution of   atrial contraction to ventricular filling. Doppler parameters are   consistent with abnormal left ventricular relaxation (grade 1   diastolic dysfunction). Doppler parameters are consistent with   high ventricular filling pressure. - Aortic valve: Mild calcification. - Mitral valve: Calcified annulus. Mild  calcification of the   anterior leaflet. - Pericardium, extracardiac: Erron Wengert trivial pericardial effusion was   identified.   Antimicrobials:  Anti-infectives (From admission, onward)   Start     Dose/Rate Route Frequency Ordered Stop   07/12/17 2200   vancomycin (VANCOCIN) IVPB 750 mg/150 ml premix     750 mg 150 mL/hr over 60 Minutes Intravenous Every 12 hours 07/12/17 1221     07/12/17 2200  ceFEPIme (MAXIPIME) 1 g in dextrose 5 % 50 mL IVPB     1 g 100 mL/hr over 30 Minutes Intravenous Every 8 hours 07/12/17 1221     07/12/17 1300  ceFEPIme (MAXIPIME) 2 g in dextrose 5 % 50 mL IVPB     2 g 100 mL/hr over 30 Minutes Intravenous  Once 07/12/17 1144 07/12/17 1522   07/12/17 1300  vancomycin (VANCOCIN) 2,000 mg in sodium chloride 0.9 % 500 mL IVPB     2,000 mg 250 mL/hr over 120 Minutes Intravenous  Once 07/12/17 1144 07/12/17 1633   07/12/17 1000  doxycycline (VIBRA-TABS) tablet 100 mg  Status:  Discontinued     100 mg Oral Every 12 hours 07/12/17 0946 07/12/17 1229   07/11/17 1800  ceFAZolin (ANCEF) IVPB 2g/100 mL premix     2 g 200 mL/hr over 30 Minutes Intravenous Every 8 hours 07/11/17 1448 07/12/17 0340   07/11/17 0700  ceFAZolin (ANCEF) IVPB 2g/100 mL premix     2 g 200 mL/hr over 30 Minutes Intravenous 30 min pre-op 07/11/17 0700 07/11/17 0915     Subjective: Breathing is about the same, maybe slgihtly better. No pain.   Objective: Vitals:   07/13/17 0400 07/13/17 0500 07/13/17 0600 07/13/17 0740  BP: (!) 149/61 (!) 155/65 (!) 158/72   Pulse: (!) 116 (!) 114 (!) 115   Resp: (!) 27 (!) 26 (!) 25   Temp:    99.7 F (37.6 C)  TempSrc:    Axillary  SpO2: 94% 95% 94%   Weight:      Height:        Intake/Output Summary (Last 24 hours) at 07/13/2017 0852 Last data filed at 07/13/2017 0300 Gross per 24 hour  Intake 0 ml  Output 3830 ml  Net -3830 ml   Filed Weights   07/11/17 0720  Weight: 117.9 kg (260 lb)    Examination:  General: No acute distress.  On bipap. Cardiovascular: Heart sounds show Priyal Musquiz regular rate, and rhythm. No gallops or rubs. No murmurs. No JVD. Lungs: Increased work of breathing on bipap.  Limited exam as pt unable to turn, decreased at bases.  Abdomen: Soft, nontender, nondistended with  normal active bowel sounds. No masses. No hepatosplenomegaly. Neurological: Alert and oriented 3. Cranial nerves II through XII grossly intact. Skin: Warm and dry. No rashes or lesions. Extremities: No clubbing or cyanosis. No edema.  Psychiatric: Mood and affect are normal. Insight and judgment are appropriate.   Data Reviewed: I have personally reviewed following labs and imaging studies  CBC: Recent Labs  Lab 07/11/17 0730 07/12/17 0526 07/12/17 1158 07/13/17 0316  WBC 12.7*  --  28.6* 38.9*  HGB 17.1* 15.1* 16.9* 16.3*  HCT 50.4* 45.5 49.3* 47.6*  MCV 86.4  --  86.6 87.0  PLT 259  --  257 255   Basic Metabolic Panel: Recent Labs  Lab 07/11/17 0730 07/12/17 0526 07/12/17 1158 07/13/17 0316  NA 142 141 139 136  K 4.9 4.3 3.4* 3.4*  CL 102 103 102 101  CO2 33*  30 27 27   GLUCOSE 103* 109* 116* 140*  BUN 17 15 13 17   CREATININE <0.30* <0.30* <0.30* <0.30*  CALCIUM 9.4 8.7* 8.9 8.7*   GFR: CrCl cannot be calculated (This lab value cannot be used to calculate CrCl because it is not [place] Warzecha number: <0.30). Liver Function Tests: Recent Labs  Lab 07/12/17 1158  AST 19  ALT 22  ALKPHOS 95  BILITOT 0.5  PROT 7.7  ALBUMIN 3.8   No results for input(s): LIPASE, AMYLASE in the last 168 hours. No results for input(s): AMMONIA in the last 168 hours. Coagulation Profile: Recent Labs  Lab 07/12/17 1158  INR 0.93   Cardiac Enzymes: Recent Labs  Lab 07/12/17 1000 07/12/17 1527 07/12/17 2154  TROPONINI <0.03 <0.03 <0.03   BNP (last 3 results) No results for input(s): PROBNP in the last 8760 hours. HbA1C: No results for input(s): HGBA1C in the last 72 hours. CBG: No results for input(s): GLUCAP in the last 168 hours. Lipid Profile: No results for input(s): CHOL, HDL, LDLCALC, TRIG, CHOLHDL, LDLDIRECT in the last 72 hours. Thyroid Function Tests: No results for input(s): TSH, T4TOTAL, FREET4, T3FREE, THYROIDAB in the last 72 hours. Anemia Panel: No results for  input(s): VITAMINB12, FOLATE, FERRITIN, TIBC, IRON, RETICCTPCT in the last 72 hours. Sepsis Labs: Recent Labs  Lab 07/12/17 1158 07/12/17 1527  PROCALCITON <0.10  --   LATICACIDVEN 1.1 1.7    Recent Results (from the past 240 hour(s))  MRSA PCR Screening     Status: None   Collection Time: 07/12/17 12:24 PM  Result Value Ref Range Status   MRSA by PCR NEGATIVE NEGATIVE Final    Comment:        The GeneXpert MRSA Assay (FDA approved for NASAL specimens only), is one component of [facility] comprehensive MRSA colonization surveillance program. It is not intended to diagnose MRSA infection nor to guide or monitor treatment for MRSA infections.          Radiology Studies: Dg Chest 1 View  Result Date: 07/12/2017 CLINICAL DATA:  Difficulty breathing. EXAM: CHEST 1 VIEW COMPARISON:  Chest x-ray 05/14/2008. FINDINGS: Large left pleural effusion noted. Decreased left lung volume suggesting with dense left base consistent with atelectasis. Underlying infiltrate cannot be excluded. Elevation left hemidiaphragm. No pneumothorax. Small right pleural effusion cannot be excluded. No pneumothorax. Heart size is difficult to assess due to opacity of the left chest. Degenerative change thoracic spine. IMPRESSION: 1.  Left lower lobe atelectasis and left pleural effusion. 2.  Small right pleural effusion cannot be excluded. Electronically Signed   By: Maisie Fus  Register   On: 07/12/2017 10:48   Dg Chest Port 1 View  Result Date: 07/13/2017 CLINICAL DATA:  Acute respiratory failure EXAM: PORTABLE CHEST 1 VIEW COMPARISON:  Yesterday FINDINGS: Low volume chest with left greater than right lung opacity. Heart size is largely obscured and could be enlarged. Interstitial coarsening without Kerley lines. No effusion or pneumothorax. IMPRESSION: 1. Improved aeration compared yesterday. 2. Left greater than right atelectasis and/or pneumonia. Electronically Signed   By: Marnee Spring M.D.   On: 07/13/2017 07:17          Scheduled Meds: . amLODipine  10 mg Oral Daily  . buPROPion  150 mg Oral Daily  . docusate sodium  100 mg Oral BID  . mouth rinse  15 mL Mouth Rinse BID  . mouth rinse  15 mL Mouth Rinse q12n4p  . oxybutynin  10 mg Oral Daily  . senna-docusate  2 tablet  Oral QHS  . venlafaxine XR  225 mg Oral Q breakfast   Continuous Infusions: . ceFEPime (MAXIPIME) IV Stopped (07/13/17 4540)  . potassium chloride    . vancomycin Stopped (07/12/17 2312)     LOS: 1 day    Time spent: over 30 min    Lacretia Nicks, MD [facility] Pager [phone]  If 7PM-7AM, please contact night-coverage [email or web address] Password [facility] 07/13/2017, 8:52 AM

## 2017-07-13 NOTE — Progress Notes (Addendum)
2 Days Post-Op Subjective: Patient on BiPAP.   Objective: Vital signs in last 24 hours: Temp:  [98.4 F (36.9 C)-99.9 F (37.7 C)] 99.9 F (37.7 C) (01/19 1115) Pulse Rate:  [109-130] 116 (01/19 1100) Resp:  [19-35] 28 (01/19 1100) BP: (128-180)/(45-82) 156/53 (01/19 1100) SpO2:  [91 %-96 %] 94 % (01/19 1100) FiO2 (%):  [90 %-100 %] 90 % (01/19 1000)  Intake/Output from previous day: 01/18 0701 - 01/19 0700 In: 0  Out: 3830 [Urine:3830] Intake/Output this shift: Total I/O In: -  Out: 775 [Urine:775]  Physical Exam:  On BiPAP Abd - soft, NT Urine clear   Lab Results: Recent Labs    07/12/17 0526 07/12/17 1158 07/13/17 0316  HGB 15.1* 16.9* 16.3*  HCT 45.5 49.3* 47.6*   BMET Recent Labs    07/12/17 1158 07/13/17 0316  NA 139 136  K 3.4* 3.4*  CL 102 101  CO2 27 27  GLUCOSE 116* 140*  BUN 13 17  CREATININE <0.30* <0.30*  CALCIUM 8.9 8.7*   Recent Labs    07/12/17 1158  INR 0.93   No results for input(s): LABURIN in the last 72 hours. Results for orders placed or performed during the hospital encounter of 07/11/17  MRSA PCR Screening     Status: None   Collection Time: 07/12/17 12:24 PM  Result Value Ref Range Status   MRSA by PCR NEGATIVE NEGATIVE Final    Comment:        The GeneXpert MRSA Assay (FDA approved for NASAL specimens only), is one component of a comprehensive MRSA colonization surveillance program. It is not intended to diagnose MRSA infection nor to guide or monitor treatment for MRSA infections.     Studies/Results: Dg Chest 1 View  Result Date: 07/12/2017 CLINICAL DATA:  Difficulty breathing. EXAM: CHEST 1 VIEW COMPARISON:  Chest x-ray 05/14/2008. FINDINGS: Large left pleural effusion noted. Decreased left lung volume suggesting with dense left base consistent with atelectasis. Underlying infiltrate cannot be excluded. Elevation left hemidiaphragm. No pneumothorax. Small right pleural effusion cannot be excluded. No  pneumothorax. Heart size is difficult to assess due to opacity of the left chest. Degenerative change thoracic spine. IMPRESSION: 1.  Left lower lobe atelectasis and left pleural effusion. 2.  Small right pleural effusion cannot be excluded. Electronically Signed   By: Maisie Fushomas  Register   On: 07/12/2017 10:48   Dg Chest Port 1 View  Result Date: 07/13/2017 CLINICAL DATA:  Acute respiratory failure EXAM: PORTABLE CHEST 1 VIEW COMPARISON:  Yesterday FINDINGS: Low volume chest with left greater than right lung opacity. Heart size is largely obscured and could be enlarged. Interstitial coarsening without Kerley lines. No effusion or pneumothorax. IMPRESSION: 1. Improved aeration compared yesterday. 2. Left greater than right atelectasis and/or pneumonia. Electronically Signed   By: Marnee SpringJonathon  Watts M.D.   On: 07/13/2017 07:17    Assessment/Plan: POD#2 - s/p cystourethroscopy, botox instillation, SPT placement. Good UOP and Cr. H/H stable.    No new GU recs; will follow.    LOS: 1 day   Jerilee FieldMatthew Novice Whitehead 07/13/2017, 1:10 PM

## 2017-07-13 NOTE — Consult Note (Signed)
PULMONARY / CRITICAL CARE MEDICINE   Name: Tara Whitehead MRN: [id number] DOB: 1961/04/30    ADMISSION DATE:  07/11/2017 CONSULTATION DATE:  07/12/17  REFERRING MD:  Drs Lowell Guitar, Dr Annabell Howells  CHIEF COMPLAINT:  Acute resp failure  BRIEF:   57 year old woman with a history of ALS followed at [facility].  She has a history of dyspnea both at rest and with her ADLs.  She was under evaluation for nocturnal ventilation, Trilogy device based on office note from 06/26/17.  Also cough assistance device and suctioning at home.  She underwent cystoscopy for suprapubic catheter placement and Botox instillation to treat small, neurogenic bladder.  Postprocedural course has been complicated by dyspnea, hypoxemia, increased work of breathing and some left-sided chest discomfort.  She has been hypertensive post procedure.  1/18 she developed acute worsening of her dyspnea, placed on BiPAP and moved to the ICU.  Chest x-ray is rotated but shows significant left-sided volume loss, possible associated effusion, with elevated left hemidiaphragm (which is still visible).  She received empiric Lasix, was started on empiric broad-spectrum antibiotics prior to transfer.  Of note she was treated recently with a short course of antibiotics and a prednisone taper in the setting of an apparent upper respiratory infection, question whether this is evolved into pneumonia.  At the least it may have influenced her secretions and affected her secretion clearance, left-sided atelectasis    CULTURES: Blood 1/18 >>   ANTIBIOTICS: Cefepime 1/18 >> Vancomycin 1/18 >>   SIGNIFICANT EVENTS: Cystoscopy, suprapubic catheter placement, Botox instillation into the bladder 1/18; Chest x-ray 1/18 >> left lower lobe atelectasis versus infiltrate with possible associated effusion    SUBJECTIVE/OVERNIGHT/INTERVAL HX 07/13/2017 - On BiPAP family at bedside. CXR with improved atleactasis with bipap 90% fio2 and lasix. Last night was  desaturatting for mouth care but this seems improved this AM. Husband and close friend of patient an RN at bedside   VITAL SIGNS: BP (!) 149/67   Pulse (!) 111   Temp 99.7 F (37.6 C) (Axillary)   Resp (!) 26   Ht 5\' 11"  (1.803 m)   Wt 117.9 kg (260 lb)   SpO2 93%   BMI 36.26 kg/m   HEMODYNAMICS:    VENTILATOR SETTINGS: FiO2 (%):  [70 %-100 %] 100 %  INTAKE / OUTPUT: I/O last 3 completed shifts: In: 0  Out: 6070 [Urine:6070]  PHYSICAL EXAMINATION:  General Appearance:    Looks criticall ill OBESE - yes  Head:    Normocephalic, without obvious abnormality, atraumatic  Eyes:    PERRL - yes, conjunctiva/corneas - clear      Ears:    Normal external ear canals, both ears  Nose:   NG tube - no but has full face bipap  Throat:  ETT TUBE - no , OG tube - no  Neck:   Supple,  No enlargement/tenderness/nodules     Lungs:     Clear to auscultation bilaterally, Ventilator   Synchrony - yes on bipap  Chest wall:    No deformity  Heart:    S1 and S2 normal, no murmur, CVP - no.  Pressors - no  Abdomen:     Soft, no masses, no organomegaly  Genitalia:    Not done  Rectal:   not done  Extremities:   Extremities- intact     Skin:   Intact in exposed areas .      Neurologic:   Sedation - none -> RASS - +1 . Moves all 4s -  yes but weak. CAM-ICU - neg for delirum . Orientation - x3+    PULMONARY Recent Labs  Lab 07/12/17 1330 07/13/17 1040  PHART 7.449 7.440  PCO2ART 44.8 48.2*  PO2ART 61.8* 91.5  HCO3 30.6* 32.2*  O2SAT 91.7 96.6    CBC Recent Labs  Lab 07/11/17 0730 07/12/17 0526 07/12/17 1158 07/13/17 0316  HGB 17.1* 15.1* 16.9* 16.3*  HCT 50.4* 45.5 49.3* 47.6*  WBC 12.7*  --  28.6* 38.9*  PLT 259  --  257 255    COAGULATION Recent Labs  Lab 07/12/17 1158  INR 0.93    CARDIAC   Recent Labs  Lab 07/12/17 1000 07/12/17 1527 07/12/17 2154  TROPONINI <0.03 <0.03 <0.03   No results for input(s): PROBNP in the last 168 hours.   CHEMISTRY Recent  Labs  Lab 07/11/17 0730 07/12/17 0526 07/12/17 1158 07/13/17 0316  NA 142 141 139 136  K 4.9 4.3 3.4* 3.4*  CL 102 103 102 101  CO2 33* 30 27 27   GLUCOSE 103* 109* 116* 140*  BUN 17 15 13 17   CREATININE <0.30* <0.30* <0.30* <0.30*  CALCIUM 9.4 8.7* 8.9 8.7*  MG  --   --   --  1.8   CrCl cannot be calculated (This lab value cannot be used to calculate CrCl because it is not a number: <0.30).   LIVER Recent Labs  Lab 07/12/17 1158  AST 19  ALT 22  ALKPHOS 95  BILITOT 0.5  PROT 7.7  ALBUMIN 3.8  INR 0.93     INFECTIOUS Recent Labs  Lab 07/12/17 1158 07/12/17 1527  LATICACIDVEN 1.1 1.7  PROCALCITON <0.10  --      ENDOCRINE CBG (last 3)  No results for input(s): GLUCAP in the last 72 hours.       IMAGING x48h  - image(s) personally visualized  -   highlighted in bold Dg Chest 1 View  Result Date: 07/12/2017 CLINICAL DATA:  Difficulty breathing. EXAM: CHEST 1 VIEW COMPARISON:  Chest x-ray 05/14/2008. FINDINGS: Large left pleural effusion noted. Decreased left lung volume suggesting with dense left base consistent with atelectasis. Underlying infiltrate cannot be excluded. Elevation left hemidiaphragm. No pneumothorax. Small right pleural effusion cannot be excluded. No pneumothorax. Heart size is difficult to assess due to opacity of the left chest. Degenerative change thoracic spine. IMPRESSION: 1.  Left lower lobe atelectasis and left pleural effusion. 2.  Small right pleural effusion cannot be excluded. Electronically Signed   By: Maisie Fus  Register   On: 07/12/2017 10:48   Dg Chest Port 1 View  Result Date: 07/13/2017 CLINICAL DATA:  Acute respiratory failure EXAM: PORTABLE CHEST 1 VIEW COMPARISON:  Yesterday FINDINGS: Low volume chest with left greater than right lung opacity. Heart size is largely obscured and could be enlarged. Interstitial coarsening without Kerley lines. No effusion or pneumothorax. IMPRESSION: 1. Improved aeration compared yesterday. 2.  Left greater than right atelectasis and/or pneumonia. Electronically Signed   By: Marnee Spring M.D.   On: 07/13/2017 07:17   LINES/TUBES:   DISCUSSION:  ASSESSMENT / PLAN:  PULMONARY A: Acute on chronic respiratory failure due to ALS, possibly exacerbated by her recent procedure/sedation with resultant left-sided atelectasis.  Consider evolving left-sided pneumonia.  Consider effects of Botox instillation, although I believe this would be less likely.  She does have chronic left-sided weakness and may have chronically abnormal left hemidiaphragm excursion  07/13/2017 - some improved subjectively and cxxr with bipap and lasix. But still long way to go  P:   Continue BiPAP  Aggressive chest PT, chest vest Empiric antibiotics have been ordered as below; will dc if culture and repat PCT negative diurese   CARDIOVASCULAR A:  Hypertension P:  Continue her current hydralazine, amlodipine And labetalol as needed is ordered  RENAL / UROLOGICAL A:   Hypokalemia Neurogenic bladder status post suprapubic catheter, Botox instillation 1/17 P:   Follow urine output, BMP, replace electrolytes as indicated Suprapubic catheter management per urology plans Remains on Ditropan  GASTROINTESTINAL A:   SUP Constipation P:   Add PPI NPO for now given risk for respiratory failure and while on BiPAP Continue senna  HEMATOLOGIC A:   Acute leukocytosis, suspect due to left-sided pneumonia P:  Follow CBC  INFECTIOUS A:   Left sided infiltrate, possible HCAP P:   Agree with empiric vancomycin, cefepime Follow blood cultures Obtain respiratory cultures if the patient is able to produce  ENDOCRINE A:   At risk hyperglycemia on corticosteroids P:   Sliding scale insulin per protocol  NEUROLOGIC A:   [facility] Depression P:   RASS goal: 0 Wellbutrin and Effexor   FAMILY  - Updates: I updated the patient and her cousin at bedside 1/18. Updated husban and  friend 07/13/2017  - Inter-disciplinary family meet or Palliative Care meeting due by:  07/19/16    The patient is critically ill with multiple organ systems failure and requires high complexity decision making for assessment and support, frequent evaluation and titration of therapies, application of advanced monitoring technologies and extensive interpretation of multiple databases.   Critical Care Time devoted to patient care services described in this note is  30  Minutes. This time reflects time of care of this signee Dr Kalman ShanMurali Chianna Spirito. This critical care time does not reflect procedure time, or teaching time or supervisory time of PA/NP/Med student/Med Resident etc but could involve care discussion time    Dr. Kalman ShanMurali Myli Pae, M.D., [facility]C.P Pulmonary and Critical Care Medicine Staff Physician [facility] [place] [facility] Pager: [phone], If no answer or between  15:00h - 7:00h: call [phone]  07/13/2017 10:43 AM

## 2017-07-14 ENCOUNTER — Inpatient Hospital Stay (HOSPITAL_COMMUNITY): Payer: PPO

## 2017-07-14 LAB — CBC WITH DIFFERENTIAL/PLATELET
BASOS PCT: 0 %
Basophils Absolute: 0 10*3/uL (ref 0.0–0.1)
Eosinophils Absolute: 0 10*3/uL (ref 0.0–0.7)
Eosinophils Relative: 0 %
HEMATOCRIT: 47.1 % — AB (ref 36.0–46.0)
HEMOGLOBIN: 15.2 g/dL — AB (ref 12.0–15.0)
LYMPHS PCT: 5 %
Lymphs Abs: 1.3 10*3/uL (ref 0.7–4.0)
MCH: 28.4 pg (ref 26.0–34.0)
MCHC: 32.3 g/dL (ref 30.0–36.0)
MCV: 88 fL (ref 78.0–100.0)
MONOS PCT: 5 %
Monocytes Absolute: 1.2 10*3/uL — ABNORMAL HIGH (ref 0.1–1.0)
NEUTROS ABS: 21.2 10*3/uL — AB (ref 1.7–7.7)
NEUTROS PCT: 90 %
Platelets: 225 10*3/uL (ref 150–400)
RBC: 5.35 MIL/uL — ABNORMAL HIGH (ref 3.87–5.11)
RDW: 15.4 % (ref 11.5–15.5)
WBC: 23.7 10*3/uL — ABNORMAL HIGH (ref 4.0–10.5)

## 2017-07-14 LAB — CREATININE, SERUM
Creatinine, Ser: 0.31 mg/dL — ABNORMAL LOW (ref 0.44–1.00)
GFR calc non Af Amer: >60 mL/min (ref >60)

## 2017-07-14 LAB — PROCALCITONIN: Procalcitonin: 0.38 ng/mL

## 2017-07-14 LAB — BASIC METABOLIC PANEL
Anion gap: 8 (ref 5–15)
BUN: 17 mg/dL (ref 6–20)
CALCIUM: 9 mg/dL (ref 8.9–10.3)
CO2: 31 mmol/L (ref 22–32)
CREATININE: 0.32 mg/dL — AB (ref 0.44–1.00)
Chloride: 102 mmol/L (ref 101–111)
GFR calc Af Amer: >60 mL/min (ref >60)
Glucose, Bld: 102 mg/dL — ABNORMAL HIGH (ref 65–99)
Potassium: 3.9 mmol/L (ref 3.5–5.1)
SODIUM: 141 mmol/L (ref 135–145)

## 2017-07-14 LAB — GLUCOSE, CAPILLARY: GLUCOSE-CAPILLARY: 99 mg/dL (ref 65–99)

## 2017-07-14 LAB — PHOSPHORUS: PHOSPHORUS: 2.4 mg/dL — AB (ref 2.5–4.6)

## 2017-07-14 LAB — MAGNESIUM: Magnesium: 2 mg/dL (ref 1.7–2.4)

## 2017-07-14 MED ORDER — NICOTINE 14 MG/24HR TD PT24
14.0000 mg | MEDICATED_PATCH | Freq: Every day | TRANSDERMAL | Status: DC
  Administered 2017-07-14 – 2017-07-23 (×10): 14 mg via TRANSDERMAL
  Filled 2017-07-14 (×10): qty 1

## 2017-07-14 MED ORDER — POTASSIUM PHOSPHATES 15 MMOLE/5ML IV SOLN
10.0000 mmol | Freq: Once | INTRAVENOUS | Status: AC
  Administered 2017-07-14: 10 mmol via INTRAVENOUS
  Filled 2017-07-14: qty 3.33

## 2017-07-14 MED ORDER — ALPRAZOLAM 0.25 MG PO TABS
0.2500 mg | ORAL_TABLET | Freq: Once | ORAL | Status: AC
  Administered 2017-07-14: 0.25 mg via ORAL
  Filled 2017-07-14: qty 1

## 2017-07-14 MED ORDER — FUROSEMIDE 10 MG/ML IJ SOLN
20.0000 mg | Freq: Once | INTRAMUSCULAR | Status: AC
  Administered 2017-07-14: 20 mg via INTRAVENOUS
  Filled 2017-07-14: qty 2

## 2017-07-14 NOTE — Progress Notes (Signed)
PULMONARY / CRITICAL CARE MEDICINE   Name: Tara Whitehead MRN: [id number] DOB: [birth year]    ADMISSION DATE:  07/11/2017 CONSULTATION DATE:  07/12/17  REFERRING MD:  Drs Lowell Guitar, Dr Annabell Howells  CHIEF COMPLAINT:  Acute resp failure  BRIEF:   57 year old woman with a history of ALS followed at [facility].  She has a history of dyspnea both at rest and with her ADLs.  She was under evaluation for nocturnal ventilation, Trilogy device based on office note from 06/26/17.  Also cough assistance device and suctioning at home.  She underwent cystoscopy for suprapubic catheter placement and Botox instillation to treat small, neurogenic bladder.  Postprocedural course has been complicated by dyspnea, hypoxemia, increased work of breathing and some left-sided chest discomfort.  She has been hypertensive post procedure.  1/18 she developed acute worsening of her dyspnea, placed on BiPAP and moved to the ICU.  Chest x-ray is rotated but shows significant left-sided volume loss, possible associated effusion, with elevated left hemidiaphragm (which is still visible).  She received empiric Lasix, was started on empiric broad-spectrum antibiotics prior to transfer.  Of note she was treated recently with a short course of antibiotics and a prednisone taper in the setting of an apparent upper respiratory infection, question whether this is evolved into pneumonia.  At the least it may have influenced her secretions and affected her secretion clearance, left-sided atelectasis    CULTURES: Blood 1/18 >>   ANTIBIOTICS: Cefepime 1/18 >> Vancomycin 1/18 >>   SIGNIFICANT EVENTS: Cystoscopy, suprapubic catheter placement, Botox instillation into the bladder 1/18; Chest x-ray 1/18 >> left lower lobe atelectasis versus infiltrate with possible associated effusion  07/13/2017 - On BiPAP family at bedside. CXR with improved atleactasis with bipap 90% fio2 and lasix. Last night was desaturatting for mouth care but this seems  improved this AM. Husband and close friend of patient an RN at bedside   SUBJECTIVE/OVERNIGHT/INTERVAL HX 07/14/17 - Was able to come off bipap x 30-45 minutes only. Now back on bipap. Did sswallow her medication. RN concerned patient very anxious and mmight benefit from a low dose benzo. Patient admits to anxiety  VITAL SIGNS: BP (!) 168/62 Comment: rn aware  Pulse (!) 120   Temp 98.8 F (37.1 C) (Oral)   Resp (!) 28   Ht 5\' 11"  (1.803 m)   Wt 117.9 kg (260 lb)   SpO2 95%   BMI 36.26 kg/m   HEMODYNAMICS:    VENTILATOR SETTINGS:    INTAKE / OUTPUT: I/O last 3 completed shifts: In: 350 [Other:150; IV Piggyback:200] Out: 1855 [Urine:1855]  PHYSICAL EXAMINATION:   General Appearance:    Looks criticall ill OBESE - yes  Head:    Normocephalic, without obvious abnormality, atraumatic  Eyes:    PERRL - yes, conjunctiva/corneas - clear      Ears:    Normal external ear canals, both ears  Nose:   NG tube - full BiPAP  Throat:  ETT TUBE - no but has bipap , OG tube - no  Neck:   Supple,  No enlargement/tenderness/nodules     Lungs:     Clear to auscultation bilaterally, on bipap  Chest wall:    No deformity  Heart:    S1 and S2 normal, no murmur, CVP - no.  Pressors - no  Abdomen:     Soft, no masses, no organomegaly  Genitalia:    Not done  Rectal:   not done  Extremities:   Extremities- intact  Skin:   Intact in exposed areas .     Neurologic:   Sedation - none -> RASS - +1 . Moves all 4s - es. CAM-ICU - neg . Orientation - x3+     PULMONARY Recent Labs  Lab 07/12/17 1330 07/13/17 1040  PHART 7.449 7.440  PCO2ART 44.8 48.2*  PO2ART 61.8* 91.5  HCO3 30.6* 32.2*  O2SAT 91.7 96.6    CBC Recent Labs  Lab 07/12/17 1158 07/13/17 0316 07/14/17 0236  HGB 16.9* 16.3* 15.2*  HCT 49.3* 47.6* 47.1*  WBC 28.6* 38.9* 23.7*  PLT 257 255 225    COAGULATION Recent Labs  Lab 07/12/17 1158  INR 0.93    CARDIAC   Recent Labs  Lab 07/12/17 1000  07/12/17 1527 07/12/17 2154  TROPONINI <0.03 <0.03 <0.03   No results for input(s): PROBNP in the last 168 hours.   CHEMISTRY Recent Labs  Lab 07/11/17 0730 07/12/17 0526 07/12/17 1158 07/13/17 0316 07/14/17 0236  NA 142 141 139 136 141  K 4.9 4.3 3.4* 3.4* 3.9  CL 102 103 102 101 102  CO2 33* 30 27 27 31   GLUCOSE 103* 109* 116* 140* 102*  BUN 17 15 13 17 17   CREATININE <0.30* <0.30* <0.30* <0.30* 0.31*  0.32*  CALCIUM 9.4 8.7* 8.9 8.7* 9.0  MG  --   --   --  1.8 2.0  PHOS  --   --   --   --  2.4*   Estimated Creatinine Clearance: 111.1 mL/min (A) (by C-G formula based on SCr of 0.32 mg/dL (L)).   LIVER Recent Labs  Lab 07/12/17 1158  AST 19  ALT 22  ALKPHOS 95  BILITOT 0.5  PROT 7.7  ALBUMIN 3.8  INR 0.93     INFECTIOUS Recent Labs  Lab 07/12/17 1158 07/12/17 1527 07/13/17 0316 07/13/17 1134 07/14/17 0236  LATICACIDVEN 1.1 1.7  --   --   --   PROCALCITON <0.10  --  0.63 0.54 0.38     ENDOCRINE CBG (last 3)  Recent Labs    07/14/17 0824  GLUCAP 99         IMAGING x48h  - image(s) personally visualized  -   highlighted in bold Dg Chest Port 1 View  Result Date: 07/14/2017 CLINICAL DATA:  57 year old female with history of hypoxia. EXAM: PORTABLE CHEST 1 VIEW COMPARISON:  Chest x-ray 07/13/2017. FINDINGS: Lung volumes remain low. There continues to be extensive airspace consolidation throughout the left lower lobe (and to a lesser extent in the right lower lobe), with slight improved aeration overall compared to yesterday's examination. Small left pleural effusion. No pneumothorax. No evidence of pulmonary edema. Heart size appears normal. Upper mediastinal contours are within normal limits. IMPRESSION: 1. Slight improvement in left lower lobe pneumonia. 2. Atelectasis and/or consolidation in the right lower lobe, similar to the prior study. 3. Small left pleural effusion. Electronically Signed   By: Trudie Reed M.D.   On: 07/14/2017 07:23    Dg Chest Port 1 View  Result Date: 07/13/2017 CLINICAL DATA:  Acute respiratory failure EXAM: PORTABLE CHEST 1 VIEW COMPARISON:  Yesterday FINDINGS: Low volume chest with left greater than right lung opacity. Heart size is largely obscured and could be enlarged. Interstitial coarsening without Kerley lines. No effusion or pneumothorax. IMPRESSION: 1. Improved aeration compared yesterday. 2. Left greater than right atelectasis and/or pneumonia. Electronically Signed   By: Marnee Spring M.D.   On: 07/13/2017 07:17   Anti-infectives (From admission, onward)  Start     Dose/Rate Route Frequency Ordered Stop   07/12/17 2200  vancomycin (VANCOCIN) IVPB 750 mg/150 ml premix  Status:  Discontinued     750 mg 150 mL/hr over 60 Minutes Intravenous Every 12 hours 07/12/17 1221 07/14/17 1003   07/12/17 2200  ceFEPIme (MAXIPIME) 1 g in dextrose 5 % 50 mL IVPB     1 g 100 mL/hr over 30 Minutes Intravenous Every 8 hours 07/12/17 1221     07/12/17 1300  ceFEPIme (MAXIPIME) 2 g in dextrose 5 % 50 mL IVPB     2 g 100 mL/hr over 30 Minutes Intravenous  Once 07/12/17 1144 07/12/17 1522   07/12/17 1300  vancomycin (VANCOCIN) 2,000 mg in sodium chloride 0.9 % 500 mL IVPB     2,000 mg 250 mL/hr over 120 Minutes Intravenous  Once 07/12/17 1144 07/12/17 1633   07/12/17 1000  doxycycline (VIBRA-TABS) tablet 100 mg  Status:  Discontinued     100 mg Oral Every 12 hours 07/12/17 0946 07/12/17 1229   07/11/17 1800  ceFAZolin (ANCEF) IVPB 2g/100 mL premix     2 g 200 mL/hr over 30 Minutes Intravenous Every 8 hours 07/11/17 1448 07/12/17 0340   07/11/17 0700  ceFAZolin (ANCEF) IVPB 2g/100 mL premix     2 g 200 mL/hr over 30 Minutes Intravenous 30 min pre-op 07/11/17 0700 07/11/17 0915       LINES/TUBES:   DISCUSSION:  ASSESSMENT / PLAN:  PULMONARY A: Acute on chronic respiratory failure due to ALS, possibly exacerbated by her recent procedure/sedation with resultant left-sided atelectasis.  Consider  evolving left-sided pneumonia.  Consider effects of Botox instillation, although I believe this would be less likely.  She does have chronic left-sided weakness and may have chronically abnormal left hemidiaphragm excursion  07/14/2017 - some improved subjectively and cxxr with bipap and lasix. But still long way to go Has Axneity  P:   Continue BiPAP  Aggressive chest PT, chest vest Empiric antibiotics have been ordered as below; Diurese Anxiety (see neuro)   CARDIOVASCULAR A:  Hypertension P:  Continue her current hydralazine, amlodipine And labetalol as needed is ordered  RENAL / UROLOGICAL A:   Hypokalemia Neurogenic bladder status post suprapubic catheter, Botox instillation 1/17 P:   Follow urine output, BMP, replace electrolytes as indicated Suprapubic catheter management per urology plans Remains on Ditropan  GASTROINTESTINAL A:   SUP Constipation P:   Add PPI NPO for now given risk for respiratory failure and while on BiPAP Continue senna  HEMATOLOGIC A:   Acute leukocytosis, suspect due to left-sided pneumonia P:  Follow CBC  INFECTIOUS A:   Left sided infiltrate, possible HCAP - PCT shows pssible localized bacterial infection P:   Dc  empiric vancomycin Ok for cefepime for now Follow blood cultures Obtain respiratory cultures if the patient is able to produce  ENDOCRINE A:   At risk hyperglycemia on corticosteroids P:   Sliding scale insulin per protocol  NEUROLOGIC A:   [facility] Depression    07/14/2017 0 admits to anxiety P:   RASS goal: 0 Wellbutrin and Effexor Try test dose xanax to see if it can help come off bipap   FAMILY  - Updates: I updated the patient and her cousin at bedside 1/18. Updated husband 07/14/2017  - Inter-disciplinary family meet or Palliative Care meeting due by:  07/19/16    The patient is critically ill with multiple organ systems failure and requires high complexity decision making  for  assessment and support, frequent evaluation and titration of therapies, application of advanced monitoring technologies and extensive interpretation of multiple databases.   Critical Care Time devoted to patient care services described in this note is  30  Minutes. This time reflects time of care of this signee Dr Kalman Shan. This critical care time does not reflect procedure time, or teaching time or supervisory time of PA/NP/Med student/Med Resident etc but could involve care discussion time    Dr. Kalman Shan, M.D., [facility].C.P Pulmonary and Critical Care Medicine Staff Physician [facility] Pager: [phone], If no answer or between  15:00h - 7:00h: call [phone]  07/14/2017 10:57 AM

## 2017-07-14 NOTE — Progress Notes (Signed)
PROGRESS NOTE    Tara Whitehead  EAV:[id number] DOB: 19-Aug-1960 DOA: 07/11/2017 PCP: Philemon KingdomProchnau, Caroline, MD   Brief Narrative:  Tara Whitehead is an 57 y.o. female 57 y.o.female with medical history significant for not limited to morbid obesity, hypertension amyotrophic lateral sclerosis, POD1 s/p intravesical botox injection and suprapubic tube placement.   We were asked to see her on 1/18 due to increased work of breathing and HTN this AM.   She had apparently been doing well until this 1/18 when she was noted to be having some shortness of breath with increased work of breathing and was started on oxygen by nasal cannula and we are consulted for further management.  Patient notes that her sibling having problems with her breathing over the last 6 months and indeed her "ALS doctor" was planning to put her on BiPAP.  However 5 days ago, on Monday she started having some problems of cough loss of breath saw her PCP who started her on steroid tablets with Z-Pak which is about completed.  She admits to chills without fever and had actually been feeling better without any respiratory distress at time of my evaluation after initiation of supplemental oxygen and able to complete full sentences.  She admits to left-sided mild aching chest pain which is nonradiating lasting for [name] few minutes this morning about 3-4/10.  Her shortness of breath is better and denies any dizziness but admits to being fatigued [name] little bit this morning.  She denies any palpitations or diaphoresis, no paroxysmal nocturnal dyspnea, and no dizziness.  Assessment & Plan:   Active Problems:   Neurogenic bladder disorder   Pressure injury of skin   SOB (shortness of breath)   Acute on chronic respiratory failure with hypoxia (HCC)  Acute Hypoxic Respiratory Failure:  Likely 2/2 HCAP.  WBC count improving today.  CXR with slight improvement in LLL pneumonia, also atelectasis vs consolidation in RLL.  She was notably [name]  difficult intubation.  With hx of ALS and reported L sided weakness, possibly some diaphragmatic weakness on that side, also effects of botox on differential, but with elevated WBC count, infectious most likely. Continue vanc/cefepime (1/18 - ).  Will continue broad spectrum for now. Chest PT, IS Procalcitonin improving She was weaned to NRB this morning, but working hard to breath, she'll likely need to go back on bipap ABG pending  Bcx pending Sputum cx  S/p lasix 40 mg 1/18.  Lasix 20 mg x1 on 1/19.  Will dose again on 1/20. Negative troponins Echo with grade 1 diastolic dysfunction, normal EF Appreciate assistance of PCCM  Hypertension: pt with PO amlodipine.  Prn labetalol ordered  Small capacity bladder with incontinence: s/p suprapubic catheter placement and botox by urology oyxbutinin  [facility]  Depression: wellbutrin/effexor  Leukocytosis: improving, likely 2/2 infection above  Hypokalemia: replete, follow mag  DVT prophylaxis: lovenox Code Status: full  Family Communication: husband at bedsdie Disposition Plan: pending improvement in resp status   Consultants:   PCCM  Procedures:  Echo 1/18 Study Conclusions  - Left ventricle: The cavity size was normal. There was severe   focal basal hypertrophy. Systolic function was normal. The   estimated ejection fraction was in the range of 60% to 65%. Wall   motion was normal; there were no regional wall motion   abnormalities. There was an increased relative contribution of   atrial contraction to ventricular filling. Doppler parameters are   consistent with abnormal left ventricular relaxation (grade  1   diastolic dysfunction). Doppler parameters are consistent with   high ventricular filling pressure. - Aortic valve: Mild calcification. - Mitral valve: Calcified annulus. Mild calcification of the   anterior leaflet. - Pericardium, extracardiac: [name] trivial pericardial effusion was    identified.   Antimicrobials:  Anti-infectives (From admission, onward)   Start     Dose/Rate Route Frequency Ordered Stop   07/12/17 2200  vancomycin (VANCOCIN) IVPB 750 mg/150 ml premix     750 mg 150 mL/hr over 60 Minutes Intravenous Every 12 hours 07/12/17 1221     07/12/17 2200  ceFEPIme (MAXIPIME) 1 g in dextrose 5 % 50 mL IVPB     1 g 100 mL/hr over 30 Minutes Intravenous Every 8 hours 07/12/17 1221     07/12/17 1300  ceFEPIme (MAXIPIME) 2 g in dextrose 5 % 50 mL IVPB     2 g 100 mL/hr over 30 Minutes Intravenous  Once 07/12/17 1144 07/12/17 1522   07/12/17 1300  vancomycin (VANCOCIN) 2,000 mg in sodium chloride 0.9 % 500 mL IVPB     2,000 mg 250 mL/hr over 120 Minutes Intravenous  Once 07/12/17 1144 07/12/17 1633   07/12/17 1000  doxycycline (VIBRA-TABS) tablet 100 mg  Status:  Discontinued     100 mg Oral Every 12 hours 07/12/17 0946 07/12/17 1229   07/11/17 1800  ceFAZolin (ANCEF) IVPB 2g/100 mL premix     2 g 200 mL/hr over 30 Minutes Intravenous Every 8 hours 07/11/17 1448 07/12/17 0340   07/11/17 0700  ceFAZolin (ANCEF) IVPB 2g/100 mL premix     2 g 200 mL/hr over 30 Minutes Intravenous 30 min pre-op 07/11/17 0700 07/11/17 0915     Subjective: Breathing about the same.  Objective: Vitals:   07/14/17 0354 07/14/17 0400 07/14/17 0500 07/14/17 0600  BP:  (!) 179/64 (!) 179/64 (!) 157/65  Pulse: (!) 119 (!) 107 90 78  Resp: (!) 22 (!) 21 (!) 24 (!) 22  Temp:      TempSrc:      SpO2: 96% 96% 95% 96%  Weight:      Height:        Intake/Output Summary (Last 24 hours) at 07/14/2017 0808 Last data filed at 07/14/2017 0400 Gross per 24 hour  Intake 350 ml  Output 1525 ml  Net -1175 ml   Filed Weights   07/11/17 0720  Weight: 117.9 kg (260 lb)    Examination:  General: No acute distress. Cardiovascular: Heart sounds show [name] regular rate, and rhythm. No gallops or rubs. No murmurs. No JVD. Lungs: Increased WOB, on nonrebreather Abdomen: Soft, nontender,  nondistended with normal active bowel sounds. No masses. No hepatosplenomegaly. Neurological: Alert and oriented 3. Cranial nerves II through XII grossly intact. Skin: Warm and dry. No rashes or lesions. Extremities: No clubbing or cyanosis. No edema.  Psychiatric: Mood and affect are normal. Insight and judgment are appropriate.   Data Reviewed: I have personally reviewed following labs and imaging studies  CBC: Recent Labs  Lab 07/11/17 0730 07/12/17 0526 07/12/17 1158 07/13/17 0316 07/14/17 0236  WBC 12.7*  --  28.6* 38.9* 23.7*  NEUTROABS  --   --   --   --  21.2*  HGB 17.1* 15.1* 16.9* 16.3* 15.2*  HCT 50.4* 45.5 49.3* 47.6* 47.1*  MCV 86.4  --  86.6 87.0 88.0  PLT 259  --  257 255 225   Basic Metabolic Panel: Recent Labs  Lab 07/11/17 0730 07/12/17 0526 07/12/17 1158 07/13/17  1610 07/14/17 0236  NA 142 141 139 136 141  K 4.9 4.3 3.4* 3.4* 3.9  CL 102 103 102 101 102  CO2 33* 30 27 27 31   GLUCOSE 103* 109* 116* 140* 102*  BUN 17 15 13 17 17   CREATININE <0.30* <0.30* <0.30* <0.30* 0.31*  0.32*  CALCIUM 9.4 8.7* 8.9 8.7* 9.0  MG  --   --   --  1.8 2.0  PHOS  --   --   --   --  2.4*   GFR: Estimated Creatinine Clearance: 111.1 mL/min (Mayleigh Tetrault) (by C-G formula based on SCr of 0.32 mg/dL (L)). Liver Function Tests: Recent Labs  Lab 07/12/17 1158  AST 19  ALT 22  ALKPHOS 95  BILITOT 0.5  PROT 7.7  ALBUMIN 3.8   No results for input(s): LIPASE, AMYLASE in the last 168 hours. No results for input(s): AMMONIA in the last 168 hours. Coagulation Profile: Recent Labs  Lab 07/12/17 1158  INR 0.93   Cardiac Enzymes: Recent Labs  Lab 07/12/17 1000 07/12/17 1527 07/12/17 2154  TROPONINI <0.03 <0.03 <0.03   BNP (last 3 results) No results for input(s): PROBNP in the last 8760 hours. HbA1C: No results for input(s): HGBA1C in the last 72 hours. CBG: No results for input(s): GLUCAP in the last 168 hours. Lipid Profile: No results for input(s): CHOL, HDL,  LDLCALC, TRIG, CHOLHDL, LDLDIRECT in the last 72 hours. Thyroid Function Tests: No results for input(s): TSH, T4TOTAL, FREET4, T3FREE, THYROIDAB in the last 72 hours. Anemia Panel: No results for input(s): VITAMINB12, FOLATE, FERRITIN, TIBC, IRON, RETICCTPCT in the last 72 hours. Sepsis Labs: Recent Labs  Lab 07/12/17 1158 07/12/17 1527 07/13/17 0316 07/13/17 1134 07/14/17 0236  PROCALCITON <0.10  --  0.63 0.54 0.38  LATICACIDVEN 1.1 1.7  --   --   --     Recent Results (from the past 240 hour(s))  Culture, blood (routine x 2)     Status: None (Preliminary result)   Collection Time: 07/12/17 11:55 AM  Result Value Ref Range Status   Specimen Description BLOOD RIGHT ANTECUBITAL  Final   Special Requests   Final    BOTTLES DRAWN AEROBIC AND ANAEROBIC Blood Culture adequate volume   Culture   Final    NO GROWTH < 24 HOURS Performed at [facility], [address], [place], [place] [address]    Report Status PENDING  Incomplete  Culture, blood (routine x 2)     Status: None (Preliminary result)   Collection Time: 07/12/17 11:55 AM  Result Value Ref Range Status   Specimen Description BLOOD BLOOD LEFT HAND  Final   Special Requests IN PEDIATRIC BOTTLE Blood Culture adequate volume  Final   Culture   Final    NO GROWTH < 24 HOURS Performed at [facility], [address], [place], [place] [address]    Report Status PENDING  Incomplete  MRSA PCR Screening     Status: None   Collection Time: 07/12/17 12:24 PM  Result Value Ref Range Status   MRSA by PCR NEGATIVE NEGATIVE Final    Comment:        The GeneXpert MRSA Assay (FDA approved for NASAL specimens only), is one component of A[place]it [place] comprehensive MRSA colonization surveillance program. It is not intended to diagnose MRSA infection nor to guide or monitor treatment for MRSA infections.          Radiology Studies: Dg Chest 1 View  Result Date: 07/12/2017 CLINICAL  DATA:  Difficulty breathing. EXAM: CHEST  1 VIEW COMPARISON:  Chest x-ray 05/14/2008. FINDINGS: Large left pleural effusion noted. Decreased left lung volume suggesting with dense left base consistent with atelectasis. Underlying infiltrate cannot be excluded. Elevation left hemidiaphragm. No pneumothorax. Small right pleural effusion cannot be excluded. No pneumothorax. Heart size is difficult to assess due to opacity of the left chest. Degenerative change thoracic spine. IMPRESSION: 1.  Left lower lobe atelectasis and left pleural effusion. 2.  Small right pleural effusion cannot be excluded. Electronically Signed   By: Maisie Fus  Register   On: 07/12/2017 10:48   Dg Chest Port 1 View  Result Date: 07/14/2017 CLINICAL DATA:  57 year old female with history of hypoxia. EXAM: PORTABLE CHEST 1 VIEW COMPARISON:  Chest x-ray 07/13/2017. FINDINGS: Lung volumes remain low. There continues to be extensive airspace consolidation throughout the left lower lobe (and to Sayler Mickiewicz lesser extent in the right lower lobe), with slight improved aeration overall compared to yesterday's examination. Small left pleural effusion. No pneumothorax. No evidence of pulmonary edema. Heart size appears normal. Upper mediastinal contours are within normal limits. IMPRESSION: 1. Slight improvement in left lower lobe pneumonia. 2. Atelectasis and/or consolidation in the right lower lobe, similar to the prior study. 3. Small left pleural effusion. Electronically Signed   By: Trudie Reed M.D.   On: 07/14/2017 07:23   Dg Chest Port 1 View  Result Date: 07/13/2017 CLINICAL DATA:  Acute respiratory failure EXAM: PORTABLE CHEST 1 VIEW COMPARISON:  Yesterday FINDINGS: Low volume chest with left greater than right lung opacity. Heart size is largely obscured and could be enlarged. Interstitial coarsening without Kerley lines. No effusion or pneumothorax. IMPRESSION: 1. Improved aeration compared yesterday. 2. Left greater than right atelectasis and/or pneumonia. Electronically Signed   By:  Marnee Spring M.D.   On: 07/13/2017 07:17        Scheduled Meds: . amLODipine  10 mg Oral Daily  . buPROPion  150 mg Oral Daily  . docusate sodium  100 mg Oral BID  . enoxaparin (LOVENOX) injection  40 mg Subcutaneous Q24H  . furosemide  20 mg Intravenous Once  . mouth rinse  15 mL Mouth Rinse BID  . mouth rinse  15 mL Mouth Rinse q12n4p  . oxybutynin  10 mg Oral Daily  . senna-docusate  2 tablet Oral QHS  . venlafaxine XR  225 mg Oral Q breakfast   Continuous Infusions: . ceFEPime (MAXIPIME) IV Stopped (07/14/17 0600)  . vancomycin Stopped (07/13/17 2255)     LOS: 2 days    Time spent: over 30 min    Lacretia Nicks, MD [facility] Pager [phone]  If 7PM-7AM, please contact night-coverage [email or web address] Password [email or web address] 07/14/2017, 8:08 AM

## 2017-07-14 NOTE — Progress Notes (Signed)
Per RN, ABg ordered while PT off BiPAP. Per RN, PT had been off BiPAP for approximately 1 hour and had to be placed back on BiPAP (by RN). RT notified of ABG at approximately 0848. RT will hold on performing ABG at this time until we have see if MD wants while on BiPAP (ABG results for 07/13/17 on BiPAP in Results Review)- RN aware.

## 2017-07-14 NOTE — Progress Notes (Signed)
3 Days Post-Op Subjective: Patient continues on BiPAP.  Objective: Vital signs in last 24 hours: Temp:  [97.7 F (36.5 C)-98.8 F (37.1 C)] 98.8 F (37.1 C) (01/20 0800) Pulse Rate:  [78-120] 88 (01/20 1100) Resp:  [20-28] 27 (01/20 1100) BP: (129-181)/(35-86) 129/35 (01/20 1100) SpO2:  [93 %-100 %] 96 % (01/20 1100)  Intake/Output from previous day: 01/19 0701 - 01/20 0700 In: 400 [IV Piggyback:250] Out: 1525 [Urine:1525] Intake/Output this shift: Total I/O In: 60 [P.O.:60] Out: -   Physical Exam:  On BiPAP Abdomen-soft, NT. The suprapubic tube dressing was removed.  The tube is granulating normally.  There is no drainage from around the tube.  Site looks clean.  Urine clear.  Lab Results: Recent Labs    07/12/17 1158 07/13/17 0316 07/14/17 0236  HGB 16.9* 16.3* 15.2*  HCT 49.3* 47.6* 47.1*   BMET Recent Labs    07/13/17 0316 07/14/17 0236  NA 136 141  K 3.4* 3.9  CL 101 102  CO2 27 31  GLUCOSE 140* 102*  BUN 17 17  CREATININE <0.30* 0.31*  0.32*  CALCIUM 8.7* 9.0   Recent Labs    07/12/17 1158  INR 0.93   No results for input(s): LABURIN in the last 72 hours. Results for orders placed or performed during the hospital encounter of 07/11/17  Culture, blood (routine x 2)     Status: None (Preliminary result)   Collection Time: 07/12/17 11:55 AM  Result Value Ref Range Status   Specimen Description BLOOD RIGHT ANTECUBITAL  Final   Special Requests   Final    BOTTLES DRAWN AEROBIC AND ANAEROBIC Blood Culture adequate volume   Culture   Final    NO GROWTH < 24 HOURS Performed at [facility], [address] [place], [place] [address]    Report Status PENDING  Incomplete  Culture, blood (routine x 2)     Status: None (Preliminary result)   Collection Time: 07/12/17 11:55 AM  Result Value Ref Range Status   Specimen Description BLOOD BLOOD LEFT HAND  Final   Special Requests IN PEDIATRIC BOTTLE Blood Culture adequate volume  Final   Culture    Final    NO GROWTH < 24 HOURS Performed at [facility], [address] [place], [place] [address]    Report Status PENDING  Incomplete  MRSA PCR Screening     Status: None   Collection Time: 07/12/17 12:24 PM  Result Value Ref Range Status   MRSA by PCR NEGATIVE NEGATIVE Final    Comment:        The GeneXpert MRSA Assay (FDA approved for NASAL specimens only), is one component of a comprehensive MRSA colonization surveillance program. It is not intended to diagnose MRSA infection nor to guide or monitor treatment for MRSA infections.     Studies/Results: Dg Chest Port 1 View  Result Date: 07/14/2017 CLINICAL DATA:  57 year old female with history of hypoxia. EXAM: PORTABLE CHEST 1 VIEW COMPARISON:  Chest x-ray 07/13/2017. FINDINGS: Lung volumes remain low. There continues to be extensive airspace consolidation throughout the left lower lobe (and to a lesser extent in the right lower lobe), with slight improved aeration overall compared to yesterday's examination. Small left pleural effusion. No pneumothorax. No evidence of pulmonary edema. Heart size appears normal. Upper mediastinal contours are within normal limits. IMPRESSION: 1. Slight improvement in left lower lobe pneumonia. 2. Atelectasis and/or consolidation in the right lower lobe, similar to the prior study. 3. Small left  pleural effusion. Electronically Signed   By: Trudie Reedaniel  Entrikin M.D.   On: 07/14/2017 07:23   Dg Chest Port 1 View  Result Date: 07/13/2017 CLINICAL DATA:  Acute respiratory failure EXAM: PORTABLE CHEST 1 VIEW COMPARISON:  Yesterday FINDINGS: Low volume chest with left greater than right lung opacity. Heart size is largely obscured and could be enlarged. Interstitial coarsening without Kerley lines. No effusion or pneumothorax. IMPRESSION: 1. Improved aeration compared yesterday. 2. Left greater than right atelectasis and/or pneumonia. Electronically Signed   By: Marnee SpringJonathon  Watts M.D.   On: 07/13/2017  07:17    Assessment/Plan: POD#3 - s/p cystourethroscopy, botox instillation, SPT placement. Good UOP and Cr. H/H stable. SP site looks good.    No new GU recs.  I discussed the patient with Dr. Lowell GuitarPowell and Dr. Marchelle Gearingamaswamy.     LOS: 2 days   Jerilee FieldMatthew Yarielis Funaro 07/14/2017, 11:45 AM

## 2017-07-15 ENCOUNTER — Inpatient Hospital Stay (HOSPITAL_COMMUNITY): Payer: PPO

## 2017-07-15 DIAGNOSIS — J9621 Acute and chronic respiratory failure with hypoxia: Secondary | ICD-10-CM

## 2017-07-15 LAB — COMPREHENSIVE METABOLIC PANEL
ALT: 13 U/L — AB (ref 14–54)
ANION GAP: 9 (ref 5–15)
AST: 13 U/L — ABNORMAL LOW (ref 15–41)
Albumin: 3 g/dL — ABNORMAL LOW (ref 3.5–5.0)
Alkaline Phosphatase: 74 U/L (ref 38–126)
BUN: 16 mg/dL (ref 6–20)
CHLORIDE: 100 mmol/L — AB (ref 101–111)
CO2: 32 mmol/L (ref 22–32)
Calcium: 8.9 mg/dL (ref 8.9–10.3)
Creatinine, Ser: <0.3 mg/dL — ABNORMAL LOW (ref 0.44–1.00)
Glucose, Bld: 99 mg/dL (ref 65–99)
POTASSIUM: 4 mmol/L (ref 3.5–5.1)
SODIUM: 141 mmol/L (ref 135–145)
Total Bilirubin: 1 mg/dL (ref 0.3–1.2)
Total Protein: 6.7 g/dL (ref 6.5–8.1)

## 2017-07-15 LAB — CBC WITH DIFFERENTIAL/PLATELET
Basophils Absolute: 0 10*3/uL (ref 0.0–0.1)
Basophils Relative: 0 %
EOS PCT: 0 %
Eosinophils Absolute: 0 10*3/uL (ref 0.0–0.7)
HCT: 45.8 % (ref 36.0–46.0)
HEMOGLOBIN: 14.6 g/dL (ref 12.0–15.0)
LYMPHS PCT: 9 %
Lymphs Abs: 1.5 10*3/uL (ref 0.7–4.0)
MCH: 28.7 pg (ref 26.0–34.0)
MCHC: 31.9 g/dL (ref 30.0–36.0)
MCV: 90 fL (ref 78.0–100.0)
MONOS PCT: 6 %
Monocytes Absolute: 1 10*3/uL (ref 0.1–1.0)
NEUTROS PCT: 85 %
Neutro Abs: 14.4 10*3/uL — ABNORMAL HIGH (ref 1.7–7.7)
Platelets: 221 10*3/uL (ref 150–400)
RBC: 5.09 MIL/uL (ref 3.87–5.11)
RDW: 15.2 % (ref 11.5–15.5)
WBC: 16.9 10*3/uL — AB (ref 4.0–10.5)

## 2017-07-15 LAB — MAGNESIUM: MAGNESIUM: 1.8 mg/dL (ref 1.7–2.4)

## 2017-07-15 LAB — PHOSPHORUS: PHOSPHORUS: 2.7 mg/dL (ref 2.5–4.6)

## 2017-07-15 LAB — PROCALCITONIN: Procalcitonin: 0.18 ng/mL

## 2017-07-15 MED ORDER — HYDRALAZINE HCL 20 MG/ML IJ SOLN
10.0000 mg | INTRAMUSCULAR | Status: DC | PRN
  Administered 2017-07-16: 20 mg via INTRAVENOUS
  Administered 2017-07-16: 10 mg via INTRAVENOUS
  Filled 2017-07-15 (×2): qty 1

## 2017-07-15 MED ORDER — LORAZEPAM 2 MG/ML IJ SOLN
0.5000 mg | INTRAMUSCULAR | Status: DC | PRN
  Administered 2017-07-16: 1 mg via INTRAVENOUS
  Administered 2017-07-16: 0.5 mg via INTRAVENOUS
  Administered 2017-07-17 – 2017-07-18 (×3): 1 mg via INTRAVENOUS
  Administered 2017-07-21: 0.5 mg via INTRAVENOUS
  Administered 2017-07-21: 1 mg via INTRAVENOUS
  Filled 2017-07-15 (×9): qty 1

## 2017-07-15 MED ORDER — FUROSEMIDE 10 MG/ML IJ SOLN
20.0000 mg | Freq: Once | INTRAMUSCULAR | Status: AC
  Administered 2017-07-15: 20 mg via INTRAVENOUS
  Filled 2017-07-15: qty 2

## 2017-07-15 MED ORDER — FUROSEMIDE 10 MG/ML IJ SOLN: 20.0000 mg | Freq: Once | INTRAMUSCULAR | Status: DC

## 2017-07-15 MED ORDER — ENOXAPARIN SODIUM 60 MG/0.6ML ~~LOC~~ SOLN
60.0000 mg | SUBCUTANEOUS | Status: DC
  Administered 2017-07-15 – 2017-07-23 (×9): 60 mg via SUBCUTANEOUS
  Filled 2017-07-15 (×10): qty 0.6

## 2017-07-15 MED ORDER — ALPRAZOLAM 0.25 MG PO TABS: 0.2500 mg | ORAL_TABLET | Freq: Two times a day (BID) | ORAL | Status: DC | PRN

## 2017-07-15 MED ORDER — SODIUM CHLORIDE 3 % IN NEBU
4.0000 mL | INHALATION_SOLUTION | Freq: Two times a day (BID) | RESPIRATORY_TRACT | Status: AC
  Administered 2017-07-15 – 2017-07-17 (×6): 4 mL via RESPIRATORY_TRACT
  Filled 2017-07-15 (×6): qty 4

## 2017-07-15 NOTE — Progress Notes (Signed)
Pharmacy Antibiotic Note  Tara JabsSharon P Cundari is a 57 y.o. female admitted on 07/11/2017 with sepsis.  Pharmacy  consulted for cefepime dosing. Patient developed respiratory distress 1/18 s/p cystourethroscopy 1/17 for botox instillation and insertion of suprapubic tube  Today, 07/15/2017  Day #4 cefepime. WBC improving.  Renal: SCr < 0.3 (SCr will OVERestimate actual CrCl d/t ALS and dec muscle mass)  Plan: Continue Cefepime 1g IV q8h. SCr remains low. Dosage remains stable and need for further dosage adjustment appears unlikely at present.  Pharmacy will sign off of consult at this time.  Will continue to monitor the patient peripherally while CCM following.  Height: 5\' 11"  (180.3 cm) Weight: 260 lb (117.9 kg) IBW/kg (Calculated) : 70.8  Temp (24hrs), Avg:98.5 F (36.9 C), Min:97.3 F (36.3 C), Max:99.1 F (37.3 C)  Recent Labs  Lab 07/11/17 0730 07/12/17 0526 07/12/17 1158 07/12/17 1527 07/13/17 0316 07/14/17 0236 07/15/17 0351  WBC 12.7*  --  28.6*  --  38.9* 23.7* 16.9*  CREATININE <0.30* <0.30* <0.30*  --  <0.30* 0.31*  0.32* <0.30*  LATICACIDVEN  --   --  1.1 1.7  --   --   --     CrCl cannot be calculated (This lab value cannot be used to calculate CrCl because it is not a number: <0.30).    Allergies  Allergen Reactions  . Chlorhexidine Gluconate Itching and Rash    CHG WIPES---HIBICLENS WIPES    Antimicrobials this admission: 1/18 vanco >> 1/20 1/18 cefepime >>  Dose adjustments this admission:  Microbiology results: 1/18 BCx: ngtd 1/18 MRSA PCR: neg  Thank you for allowing pharmacy to be a part of this patient's care.  Clance BollAmanda Ellory Khurana, PharmD, BCPS Pager: [phone] 07/15/2017 10:45 AM

## 2017-07-15 NOTE — Evaluation (Signed)
SLP Cancellation Note  Patient Details Name: Tara Whitehead MRN: [id number] DOB: Jun 04, 1961   Cancelled treatment:       Reason Eval/Treat Not Completed: Medical issues which prohibited therapy(pt on Bipap and per CCM - to be strictly NPO with aggressive chest PT, will continue efforts when pt medically ready, thanks)   Chales AbrahamsKimball, Penni Penado Ann 07/15/2017, 9:38 AM   Donavan Burnetamara Julisa Flippo, MS [facility] S[facility] [phone]

## 2017-07-15 NOTE — Progress Notes (Signed)
The patient was sat in the chair position at 1000 this AM. She has since refused to be repositioned. The need and purpose of Q2H repositioning was explained in detail. Will continue to educate pt and encourage turning and repositioning.

## 2017-07-15 NOTE — Progress Notes (Signed)
PULMONARY / CRITICAL CARE MEDICINE   Name: Tara Whitehead MRN: [id number] DOB: [birth year]    ADMISSION DATE:  07/11/2017 CONSULTATION DATE:  07/12/17  REFERRING MD:  Drs Lowell Guitar, Dr Annabell Howells  CHIEF COMPLAINT:  Acute resp failure  BRIEF:   57 year old woman with a history of ALS followed at [facility].  She has a history of dyspnea both at rest and with her ADLs.  She was under evaluation for nocturnal ventilation, Trilogy device based on office note from 06/26/17.  Also cough assistance device and suctioning at home.  She underwent cystoscopy for suprapubic catheter placement and Botox instillation to treat small, neurogenic bladder.  Postprocedural course has been complicated by dyspnea, hypoxemia, increased work of breathing and some left-sided chest discomfort.  She has been hypertensive post procedure.  1/18 she developed acute worsening of her dyspnea, placed on BiPAP and moved to the ICU.  Chest x-ray is rotated but shows significant left-sided volume loss, possible associated effusion, with elevated left hemidiaphragm (which is still visible).  She received empiric Lasix, was started on empiric broad-spectrum antibiotics prior to transfer.  Of note she was treated recently with a short course of antibiotics and a prednisone taper in the setting of an apparent upper respiratory infection, question whether this is evolved into pneumonia.  At the least it may have influenced her secretions and affected her secretion clearance, left-sided atelectasis    CULTURES: Blood 1/18 >>   ANTIBIOTICS: Cefepime 1/18 >> Vancomycin 1/18 >>   SIGNIFICANT EVENTS: Cystoscopy, suprapubic catheter placement, Botox instillation into the bladder 1/18; Chest x-ray 1/18 >> left lower lobe atelectasis versus infiltrate with possible associated effusion  07/13/2017 - desatn on bipap   SUBJECTIVE/OVERNIGHT/INTERVAL HX  Off bipap x 1hr on 1/20 am, has required continuous since Down to 60% Afebrile   VITAL  SIGNS: BP (!) 183/82   Pulse (!) 108   Temp 99.1 F (37.3 C) (Axillary)   Resp (!) 28   Ht 5\' 11"  (1.803 m)   Wt 260 lb (117.9 kg)   SpO2 96%   BMI 36.26 kg/m   HEMODYNAMICS:    VENTILATOR SETTINGS:    INTAKE / OUTPUT: I/O last 3 completed shifts: In: 845 [P.O.:395; IV Piggyback:450] Out: 1700 [Urine:1700]  PHYSICAL EXAMINATION:   General Appearance:    Looks criticall ill OBESE   Head:    Normocephalic, without obvious abnormality, atraumatic  Eyes:    PERRL - yes, conjunctiva/corneas - clear      Ears:    Normal external ear canals, both ears     Throat:  FFM  bipap  Neck:   Supple,  No enlargement/tenderness/nodules     Lungs:     Clear to auscultation bilaterally, on bipap  Chest wall:    No deformity  Heart:    S1 and S2 normal, no murmur, CVP - no.  Pressors - no  Abdomen:     Soft, no masses, no organomegaly  Genitalia:    Not done  Rectal:   not done  Extremities:   Extremities- intact     Skin:   Intact in exposed areas .     Neurologic:  able to communicate by writing, wasting of hand muscles     PULMONARY Recent Labs  Lab 07/12/17 1330 07/13/17 1040  PHART 7.449 7.440  PCO2ART 44.8 48.2*  PO2ART 61.8* 91.5  HCO3 30.6* 32.2*  O2SAT 91.7 96.6    CBC Recent Labs  Lab 07/13/17 0316 07/14/17 0236 07/15/17 0351  HGB  16.3* 15.2* 14.6  HCT 47.6* 47.1* 45.8  WBC 38.9* 23.7* 16.9*  PLT 255 225 221    COAGULATION Recent Labs  Lab 07/12/17 1158  INR 0.93    CARDIAC   Recent Labs  Lab 07/12/17 1000 07/12/17 1527 07/12/17 2154  TROPONINI <0.03 <0.03 <0.03   No results for input(s): PROBNP in the last 168 hours.   CHEMISTRY Recent Labs  Lab 07/12/17 0526 07/12/17 1158 07/13/17 0316 07/14/17 0236 07/15/17 0351  NA 141 139 136 141 141  K 4.3 3.4* 3.4* 3.9 4.0  CL 103 102 101 102 100*  CO2 30 27 27 31  32  GLUCOSE 109* 116* 140* 102* 99  BUN 15 13 17 17 16   CREATININE <0.30* <0.30* <0.30* 0.31*  0.32* <0.30*  CALCIUM  8.7* 8.9 8.7* 9.0 8.9  MG  --   --  1.8 2.0 1.8  PHOS  --   --   --  2.4* 2.7   CrCl cannot be calculated (This lab value cannot be used to calculate CrCl because it is not a number: <0.30).   LIVER Recent Labs  Lab 07/12/17 1158 07/15/17 0351  AST 19 13*  ALT 22 13*  ALKPHOS 95 74  BILITOT 0.5 1.0  PROT 7.7 6.7  ALBUMIN 3.8 3.0*  INR 0.93  --      INFECTIOUS Recent Labs  Lab 07/12/17 1158 07/12/17 1527  07/13/17 1134 07/14/17 0236 07/15/17 0351  LATICACIDVEN 1.1 1.7  --   --   --   --   PROCALCITON <0.10  --    < > 0.54 0.38 0.18   < > = values in this interval not displayed.     ENDOCRINE CBG (last 3)  Recent Labs    07/14/17 0824  GLUCAP 99         IMAGING x48h  - image(s) personally visualized  -   highlighted in bold Dg Chest Port 1 View  Result Date: 07/15/2017 CLINICAL DATA:  Hypoxia EXAM: PORTABLE CHEST 1 VIEW COMPARISON:  Chest x-ray of July 14, 2017 FINDINGS: The right lung is adequately inflated. On the left there is persistent density at the lung base which has not greatly changed. There is no pneumothorax. The heart and pulmonary vascularity are normal. The mediastinum is normal in width. The bony thorax is unremarkable. IMPRESSION: Persistent left lower lobe atelectasis or pneumonia and small effusion. Minimal right basilar subsegmental atelectasis. No CHF. Electronically Signed   By: David  SwazilandJordan M.D.   On: 07/15/2017 08:09   Dg Chest Port 1 View  Result Date: 07/14/2017 CLINICAL DATA:  57 year old female with history of hypoxia. EXAM: PORTABLE CHEST 1 VIEW COMPARISON:  Chest x-ray 07/13/2017. FINDINGS: Lung volumes remain low. There continues to be extensive airspace consolidation throughout the left lower lobe (and to a lesser extent in the right lower lobe), with slight improved aeration overall compared to yesterday's examination. Small left pleural effusion. No pneumothorax. No evidence of pulmonary edema. Heart size appears normal.  Upper mediastinal contours are within normal limits. IMPRESSION: 1. Slight improvement in left lower lobe pneumonia. 2. Atelectasis and/or consolidation in the right lower lobe, similar to the prior study. 3. Small left pleural effusion. Electronically Signed   By: Trudie Reedaniel  Entrikin M.D.   On: 07/14/2017 07:23   Anti-infectives (From admission, onward)   Start     Dose/Rate Route Frequency Ordered Stop   07/12/17 2200  vancomycin (VANCOCIN) IVPB 750 mg/150 ml premix  Status:  Discontinued  750 mg 150 mL/hr over 60 Minutes Intravenous Every 12 hours 07/12/17 1221 07/14/17 1003   07/12/17 2200  ceFEPIme (MAXIPIME) 1 g in dextrose 5 % 50 mL IVPB     1 g 100 mL/hr over 30 Minutes Intravenous Every 8 hours 07/12/17 1221     07/12/17 1300  ceFEPIme (MAXIPIME) 2 g in dextrose 5 % 50 mL IVPB     2 g 100 mL/hr over 30 Minutes Intravenous  Once 07/12/17 1144 07/12/17 1522   07/12/17 1300  vancomycin (VANCOCIN) 2,000 mg in sodium chloride 0.9 % 500 mL IVPB     2,000 mg 250 mL/hr over 120 Minutes Intravenous  Once 07/12/17 1144 07/12/17 1633   07/12/17 1000  doxycycline (VIBRA-TABS) tablet 100 mg  Status:  Discontinued     100 mg Oral Every 12 hours 07/12/17 0946 07/12/17 1229   07/11/17 1800  ceFAZolin (ANCEF) IVPB 2g/100 mL premix     2 g 200 mL/hr over 30 Minutes Intravenous Every 8 hours 07/11/17 1448 07/12/17 0340   07/11/17 0700  ceFAZolin (ANCEF) IVPB 2g/100 mL premix     2 g 200 mL/hr over 30 Minutes Intravenous 30 min pre-op 07/11/17 0700 07/11/17 0915       LINES/TUBES:   DISCUSSION:  ASSESSMENT / PLAN:  PULMONARY A: Acute on chronic respiratory failure due to ALS, possibly exacerbated by her recent procedure/sedation with resultant left-sided atelectasis.  & left-sided pneumonia.   She does have chronic left-sided weakness and may have chronically abnormal left hemidiaphragm excursion  1/21 improved FIO2 but remains bipap dependent P:   Continue BiPAP , breaks x 1-2 h if  tolerated every 6h Aggressive chest PT, chest vest Strict npo  CARDIOVASCULAR A:  Hypertension P:   hydralazine,labetalol prn Dc amlodipin   RENAL / UROLOGICAL A:   Hypokalemia Neurogenic bladder status post suprapubic catheter, Botox instillation 1/17 P:   Follow urine output, BMP, replace electrolytes as indicated Suprapubic catheter management per urology    GASTROINTESTINAL A:   SUP Constipation Protein calorie malnutrition,mild P:   Add PPI NPO strict Dulcolax prn suppository  HEMATOLOGIC A:   Acute leukocytosis,  due to left-sided pneumonia , improved P:  Follow CBC  INFECTIOUS A:   Left sided infiltrate, possible HCAP - PCT decreasing P:   Dc  empiric vancomycin  cefepime x 7-10 ds Follow blood cultures Obtain respiratory cultures if the patient is able to produce  ENDOCRINE A:   At risk hyperglycemia on corticosteroids P:   Sliding scale insulin per protocol  NEUROLOGIC A:   ALS, followed at [facility] Depression Anxiety P:   RASS goal: 0 Wellbutrin and Effexor Ativan prn   FAMILY  - Updates: 1/21 Extensive discussion with patient and her husband regarding goals of care.  Complicated situation her mother -has severe multiple sclerosis with stage IV decub and is a nursing home and expected to die soon.  Patient does not want to pass before her mother.  She never wanted tracheostomy or feeding tube but did not expect that her condition  would deteriorate so soon. now considering  - Inter-disciplinary family meet or Palliative Care meeting due by:  07/19/16   The patient is critically ill with multiple organ systems failure and requires high complexity decision making for assessment and support, frequent evaluation and titration of therapies, application of advanced monitoring technologies and extensive interpretation of multiple databases. Critical Care Time devoted to patient care services described in this note independent of APP/resident   time  is 32 minutes.   Comer Locket Vassie Loll MD  07/15/2017 9:21 AM

## 2017-07-15 NOTE — Progress Notes (Addendum)
PROGRESS NOTE    Tara Whitehead  ZOX:[id number] DOB: 1960-08-03 DOA: 07/11/2017 PCP: Philemon Kingdom, MD   Brief Narrative:  Tara Whitehead is an 57 y.o. female 57 y.o.female with medical history significant for not limited to morbid obesity, hypertension amyotrophic lateral sclerosis, POD1 s/p intravesical botox injection and suprapubic tube placement.   We were asked to see her on 1/18 due to increased work of breathing and HTN this AM.   She had apparently been doing well until this 1/18 when she was noted to be having some shortness of breath with increased work of breathing and was started on oxygen by nasal cannula and we are consulted for further management.  Patient notes that her sibling having problems with her breathing over the last 6 months and indeed her "ALS doctor" was planning to put her on BiPAP.  However 5 days ago, on Monday she started having some problems of cough loss of breath saw her PCP who started her on steroid tablets with Z-Pak which is about completed.  She admits to chills without fever and had actually been feeling better without any respiratory distress at time of my evaluation after initiation of supplemental oxygen and able to complete full sentences.  She admits to left-sided mild aching chest pain which is nonradiating lasting for Dhanvin Szeto few minutes this morning about 3-4/10.  Her shortness of breath is better and denies any dizziness but admits to being fatigued Marti Mclane little bit this morning.  She denies any palpitations or diaphoresis, no paroxysmal nocturnal dyspnea, and no dizziness.  Assessment & Plan:   Active Problems:   Neurogenic bladder disorder   Pressure injury of skin   SOB (shortness of breath)   Acute on chronic respiratory failure with hypoxia (HCC)  Acute Hypoxic Respiratory Failure:  Likely 2/2 HCAP.  WBC count improving today.  CXR with slight improvement in LLL pneumonia, also atelectasis vs consolidation in RLL.  She was notably Teea Ducey  difficult intubation.  With hx of ALS and reported L sided weakness, possibly some diaphragmatic weakness on that side, also effects of botox on differential, but with elevated WBC count, infectious most likely. vanc (1/18-1/20) cefepime (1/18- ).  Chest PT, IS Procalcitonin improving Continue on bipap, hopefully can have some breaks over the day today, but still working hard to breath Bcx NGTDx2 Sputum cx if able Will follow repeat CXR repeat lasix today Negative troponins Echo with grade 1 diastolic dysfunction, normal EF Appreciate assistance of PCCM Some concern for aspiration with sips with meds, will order SLP eval when able to be done (pt on bipap majority of past few days)  Hypertension: pt with PO amlodipine.  Prn labetalol ordered  Small capacity bladder with incontinence: s/p suprapubic catheter placement and botox by urology oyxbutinin  ALS: follows at [facility]  Depression  Anxiety: wellbutrin/effexor - will add low dose xanax BID prn  Leukocytosis: improving, likely 2/2 infection above  Hypokalemia: replete, follow mag  Pt to be transferred to [facility] service as primary  DVT prophylaxis: lovenox Code Status: full  Family Communication: husband at bedsdie Disposition Plan: pending improvement in resp status   Consultants:   PCCM  Procedures:  Echo 1/18 Study Conclusions  - Left ventricle: The cavity size was normal. There was severe   focal basal hypertrophy. Systolic function was normal. The   estimated ejection fraction was in the range of 60% to 65%. Wall   motion was normal; there were no regional wall motion   abnormalities. There was  an increased relative contribution of   atrial contraction to ventricular filling. Doppler parameters are   consistent with abnormal left ventricular relaxation (grade 1   diastolic dysfunction). Doppler parameters are consistent with   high ventricular filling pressure. - Aortic valve: Mild calcification. - Mitral valve:  Calcified annulus. Mild calcification of the   anterior leaflet. - Pericardium, extracardiac: Lela Gell trivial pericardial effusion was   identified.   Antimicrobials:  Anti-infectives (From admission, onward)   Start     Dose/Rate Route Frequency Ordered Stop   07/12/17 2200  vancomycin (VANCOCIN) IVPB 750 mg/150 ml premix  Status:  Discontinued     750 mg 150 mL/hr over 60 Minutes Intravenous Every 12 hours 07/12/17 1221 07/14/17 1003   07/12/17 2200  ceFEPIme (MAXIPIME) 1 g in dextrose 5 % 50 mL IVPB     1 g 100 mL/hr over 30 Minutes Intravenous Every 8 hours 07/12/17 1221     07/12/17 1300  ceFEPIme (MAXIPIME) 2 g in dextrose 5 % 50 mL IVPB     2 g 100 mL/hr over 30 Minutes Intravenous  Once 07/12/17 1144 07/12/17 1522   07/12/17 1300  vancomycin (VANCOCIN) 2,000 mg in sodium chloride 0.9 % 500 mL IVPB     2,000 mg 250 mL/hr over 120 Minutes Intravenous  Once 07/12/17 1144 07/12/17 1633   07/12/17 1000  doxycycline (VIBRA-TABS) tablet 100 mg  Status:  Discontinued     100 mg Oral Every 12 hours 07/12/17 0946 07/12/17 1229   07/11/17 1800  ceFAZolin (ANCEF) IVPB 2g/100 mL premix     2 g 200 mL/hr over 30 Minutes Intravenous Every 8 hours 07/11/17 1448 07/12/17 0340   07/11/17 0700  ceFAZolin (ANCEF) IVPB 2g/100 mL premix     2 g 200 mL/hr over 30 Minutes Intravenous 30 min pre-op 07/11/17 0700 07/11/17 0915     Subjective: C/o mild HA. Breathing still labored.   Objective: Vitals:   07/15/17 0320 07/15/17 0400 07/15/17 0414 07/15/17 0500  BP:  (!) 171/90  (!) 183/82  Pulse:   84   Resp:  (!) 28 (!) 24 (!) 25  Temp: 98.9 F (37.2 C)     TempSrc: Axillary     SpO2:  99% 98% 98%  Weight:      Height:        Intake/Output Summary (Last 24 hours) at 07/15/2017 0800 Last data filed at 07/15/2017 0000 Gross per 24 hour  Intake 795 ml  Output 950 ml  Net -155 ml   Filed Weights   07/11/17 0720  Weight: 117.9 kg (260 lb)    Examination:  General: Working hard to  breath Cardiovascular: Heart sounds show Josefine Fuhr regular rate, and rhythm. No gallops or rubs. No murmurs. No JVD. Lungs: Decreased breath sounds on L, rhonchi noted throughout, increased WOB Abdomen: Soft, nontender, nondistended with normal active bowel sounds. No masses. No hepatosplenomegaly. Neurological: Alert and oriented 3. Cranial nerves II through XII grossly intact. Skin: Warm and dry. No rashes or lesions.  Extremities: No clubbing or cyanosis. No edema.  Psychiatric: Mood and affect are normal. Insight and judgment are appropriate.   Data Reviewed: I have personally reviewed following labs and imaging studies  CBC: Recent Labs  Lab 07/11/17 0730 07/12/17 0526 07/12/17 1158 07/13/17 0316 07/14/17 0236 07/15/17 0351  WBC 12.7*  --  28.6* 38.9* 23.7* 16.9*  NEUTROABS  --   --   --   --  21.2* 14.4*  HGB 17.1* 15.1* 16.9* 16.3*  15.2* 14.6  HCT 50.4* 45.5 49.3* 47.6* 47.1* 45.8  MCV 86.4  --  86.6 87.0 88.0 90.0  PLT 259  --  257 255 225 221   Basic Metabolic Panel: Recent Labs  Lab 07/12/17 0526 07/12/17 1158 07/13/17 0316 07/14/17 0236 07/15/17 0351  NA 141 139 136 141 141  K 4.3 3.4* 3.4* 3.9 4.0  CL 103 102 101 102 100*  CO2 30 27 27 31  32  GLUCOSE 109* 116* 140* 102* 99  BUN 15 13 17 17 16   CREATININE <0.30* <0.30* <0.30* 0.31*  0.32* <0.30*  CALCIUM 8.7* 8.9 8.7* 9.0 8.9  MG  --   --  1.8 2.0 1.8  PHOS  --   --   --  2.4* 2.7   GFR: CrCl cannot be calculated (This lab value cannot be used to calculate CrCl because it is not Nastasha Reising number: <0.30). Liver Function Tests: Recent Labs  Lab 07/12/17 1158 07/15/17 0351  AST 19 13*  ALT 22 13*  ALKPHOS 95 74  BILITOT 0.5 1.0  PROT 7.7 6.7  ALBUMIN 3.8 3.0*   No results for input(s): LIPASE, AMYLASE in the last 168 hours. No results for input(s): AMMONIA in the last 168 hours. Coagulation Profile: Recent Labs  Lab 07/12/17 1158  INR 0.93   Cardiac Enzymes: Recent Labs  Lab 07/12/17 1000  07/12/17 1527 07/12/17 2154  TROPONINI <0.03 <0.03 <0.03   BNP (last 3 results) No results for input(s): PROBNP in the last 8760 hours. HbA1C: No results for input(s): HGBA1C in the last 72 hours. CBG: Recent Labs  Lab 07/14/17 0824  GLUCAP 99   Lipid Profile: No results for input(s): CHOL, HDL, LDLCALC, TRIG, CHOLHDL, LDLDIRECT in the last 72 hours. Thyroid Function Tests: No results for input(s): TSH, T4TOTAL, FREET4, T3FREE, THYROIDAB in the last 72 hours. Anemia Panel: No results for input(s): VITAMINB12, FOLATE, FERRITIN, TIBC, IRON, RETICCTPCT in the last 72 hours. Sepsis Labs: Recent Labs  Lab 07/12/17 1158 07/12/17 1527 07/13/17 0316 07/13/17 1134 07/14/17 0236 07/15/17 0351  PROCALCITON <0.10  --  0.63 0.54 0.38 0.18  LATICACIDVEN 1.1 1.7  --   --   --   --     Recent Results (from the past 240 hour(s))  Culture, blood (routine x 2)     Status: None (Preliminary result)   Collection Time: 07/12/17 11:55 AM  Result Value Ref Range Status   Specimen Description BLOOD RIGHT ANTECUBITAL  Final   Special Requests   Final    BOTTLES DRAWN AEROBIC AND ANAEROBIC Blood Culture adequate volume   Culture   Final    NO GROWTH 2 DAYS Performed at [facility], [address] [place], [place] [address]    Report Status PENDING  Incomplete  Culture, blood (routine x 2)     Status: None (Preliminary result)   Collection Time: 07/12/17 11:55 AM  Result Value Ref Range Status   Specimen Description BLOOD BLOOD LEFT HAND  Final   Special Requests IN PEDIATRIC BOTTLE Blood Culture adequate volume  Final   Culture   Final    NO GROWTH 2 DAYS Performed at [facility], [address] [place], [place] [address]    Report Status PENDING  Incomplete  MRSA PCR Screening     Status: None   Collection Time: 07/12/17 12:24 PM  Result Value Ref Range Status   MRSA by PCR NEGATIVE NEGATIVE Final    Comment:  The GeneXpert MRSA Assay (FDA approved for NASAL  specimens only), is one component of Marlo Arriola comprehensive MRSA colonization surveillance program. It is not intended to diagnose MRSA infection nor to guide or monitor treatment for MRSA infections.          Radiology Studies: Dg Chest Port 1 View  Result Date: 07/14/2017 CLINICAL DATA:  58 year old female with history of hypoxia. EXAM: PORTABLE CHEST 1 VIEW COMPARISON:  Chest x-ray 07/13/2017. FINDINGS: Lung volumes remain low. There continues to be extensive airspace consolidation throughout the left lower lobe (and to Margree Gimbel lesser extent in the right lower lobe), with slight improved aeration overall compared to yesterday's examination. Small left pleural effusion. No pneumothorax. No evidence of pulmonary edema. Heart size appears normal. Upper mediastinal contours are within normal limits. IMPRESSION: 1. Slight improvement in left lower lobe pneumonia. 2. Atelectasis and/or consolidation in the right lower lobe, similar to the prior study. 3. Small left pleural effusion. Electronically Signed   By: Trudie Reed M.D.   On: 07/14/2017 07:23        Scheduled Meds: . amLODipine  10 mg Oral Daily  . buPROPion  150 mg Oral Daily  . docusate sodium  100 mg Oral BID  . enoxaparin (LOVENOX) injection  40 mg Subcutaneous Q24H  . mouth rinse  15 mL Mouth Rinse BID  . mouth rinse  15 mL Mouth Rinse q12n4p  . nicotine  14 mg Transdermal Daily  . oxybutynin  10 mg Oral Daily  . senna-docusate  2 tablet Oral QHS  . venlafaxine XR  225 mg Oral Q breakfast   Continuous Infusions: . ceFEPime (MAXIPIME) IV 1 g (07/15/17 4540)     LOS: 3 days    Time spent: over 30 min    Lacretia Nicks, MD [facility] Pager [phone]  If 7PM-7AM, please contact night-coverage [email or web address] Password [email or web address] 07/15/2017, 8:00 AM

## 2017-07-15 NOTE — Progress Notes (Signed)
Date:  July 15, 2017 Chart reviewed for concurrent status and case management needs.  Will continue to follow patient progress./Remains on bipap for respiratory status.  Discharge Planning: following for needs.  Will follow for hhc needs per md request.  Expected discharge date: July 18, 2017 Marcelle SmilingRhonda Davis, BSN, [place]3, [place]   [phone]

## 2017-07-15 NOTE — Progress Notes (Signed)
4 Days Post-Op   Assessment and Plan: ALS with neurgenic bladder with incontience, POD#4 from Botox and SP tube insertion complicated by post op respiratory issues and possible pneumonia.    No new GU recommendations.    I would like to transfer to the medical service if acceptable.    Subjective: CC: SOB  Hx: Tara Whitehead is POD#4 from botox and SP tube insertion for a neurogenic bladder with incontinence secondary to ALS.  Her tube is draining well with good output and clear urine.  She remains on the bipap but continues to have increased work of breathing.   ROS:  Review of Systems  Respiratory: Positive for shortness of breath.     Anti-infectives: Anti-infectives (From admission, onward)   Start     Dose/Rate Route Frequency Ordered Stop   07/12/17 2200  vancomycin (VANCOCIN) IVPB 750 mg/150 ml premix  Status:  Discontinued     750 mg 150 mL/hr over 60 Minutes Intravenous Every 12 hours 07/12/17 1221 07/14/17 1003   07/12/17 2200  ceFEPIme (MAXIPIME) 1 g in dextrose 5 % 50 mL IVPB     1 g 100 mL/hr over 30 Minutes Intravenous Every 8 hours 07/12/17 1221     07/12/17 1300  ceFEPIme (MAXIPIME) 2 g in dextrose 5 % 50 mL IVPB     2 g 100 mL/hr over 30 Minutes Intravenous  Once 07/12/17 1144 07/12/17 1522   07/12/17 1300  vancomycin (VANCOCIN) 2,000 mg in sodium chloride 0.9 % 500 mL IVPB     2,000 mg 250 mL/hr over 120 Minutes Intravenous  Once 07/12/17 1144 07/12/17 1633   07/12/17 1000  doxycycline (VIBRA-TABS) tablet 100 mg  Status:  Discontinued     100 mg Oral Every 12 hours 07/12/17 0946 07/12/17 1229   07/11/17 1800  ceFAZolin (ANCEF) IVPB 2g/100 mL premix     2 g 200 mL/hr over 30 Minutes Intravenous Every 8 hours 07/11/17 1448 07/12/17 0340   07/11/17 0700  ceFAZolin (ANCEF) IVPB 2g/100 mL premix     2 g 200 mL/hr over 30 Minutes Intravenous 30 min pre-op 07/11/17 0700 07/11/17 0915      Current Facility-Administered Medications  Medication Dose Route Frequency  Provider Last Rate Last Dose  . acetaminophen (TYLENOL) tablet 650 mg  650 mg Oral Q4H PRN Filippou, Marzetta Board, MD   650 mg at 07/11/17 1817  . albuterol (PROVENTIL) (2.5 MG/3ML) 0.083% nebulizer solution  2.5 mg/hr Nebulization QID PRN Osei-Bonsu, Greggory Stallion, MD      . amLODipine (NORVASC) tablet 10 mg  10 mg Oral Daily Osei-Bonsu, Greggory Stallion, MD   10 mg at 07/14/17 0945  . belladonna-opium (B&O SUPPRETTES) 16.2-30 MG suppository 1 suppository  1 suppository Rectal Q6H PRN Filippou, Marzetta Board, MD      . buPROPion (WELLBUTRIN XL) 24 hr tablet 150 mg  150 mg Oral Daily Filippou, Marzetta Board, MD   150 mg at 07/14/17 0945  . ceFEPIme (MAXIPIME) 1 g in dextrose 5 % 50 mL IVPB  1 g Intravenous Q8H Aleda Grana, RPH 100 mL/hr at 07/15/17 0619 1 g at 07/15/17 0619  . clotrimazole (LOTRIMIN) 1 % cream 1 application  1 application Topical BID PRN Filippou, Marzetta Board, MD      . docusate sodium (COLACE) capsule 100 mg  100 mg Oral BID Filippou, Marzetta Board, MD   100 mg at 07/14/17 2253  . enoxaparin (LOVENOX) injection 40 mg  40 mg Subcutaneous Q24H Zigmund Daniel., MD  40 mg at 07/14/17 0946  . labetalol (NORMODYNE,TRANDATE) injection 10 mg  10 mg Intravenous Q2H PRN Zigmund Daniel., MD   10 mg at 07/15/17 0203  . MEDLINE mouth rinse  15 mL Mouth Rinse BID Bjorn Pippin, MD   15 mL at 07/14/17 2200  . MEDLINE mouth rinse  15 mL Mouth Rinse q12n4p Oretha Milch, MD   15 mL at 07/14/17 1200  . nicotine (NICODERM CQ - dosed in mg/24 hours) patch 14 mg  14 mg Transdermal Daily Audrea Muscat T, NP   14 mg at 07/14/17 2253  . ondansetron (ZOFRAN) injection 4 mg  4 mg Intravenous Q4H PRN Filippou, Marzetta Board, MD      . oxybutynin (DITROPAN-XL) 24 hr tablet 10 mg  10 mg Oral Daily Filippou, Marzetta Board, MD   10 mg at 07/14/17 0945  . oxyCODONE (Oxy IR/ROXICODONE) immediate release tablet 5 mg  5 mg Oral Q4H PRN Filippou, Marzetta Board, MD   5 mg at 07/14/17 2009  . senna-docusate (Senokot-S) tablet 2 tablet  2 tablet  Oral QHS Filippou, Marzetta Board, MD   2 tablet at 07/14/17 2253  . triamcinolone cream (KENALOG) 0.1 % 1 application  1 application Topical BID PRN Filippou, Marzetta Board, MD      . venlafaxine XR (EFFEXOR-XR) 24 hr capsule 225 mg  225 mg Oral Q breakfast Filippou, Marzetta Board, MD   225 mg at 07/14/17 0950     Objective: Vital signs in last 24 hours: Temp:  [97.3 F (36.3 C)-99 F (37.2 C)] 98.9 F (37.2 C) (01/21 0320) Pulse Rate:  [84-120] 84 (01/21 0414) Resp:  [20-31] 25 (01/21 0500) BP: (120-204)/(35-92) 183/82 (01/21 0500) SpO2:  [92 %-100 %] 98 % (01/21 0500)  Intake/Output from previous day: 01/20 0701 - 01/21 0700 In: 795 [P.O.:395; IV Piggyback:400] Out: 950 [Urine:950] Intake/Output this shift: No intake/output data recorded.   Physical Exam  Vitals reviewed.   Lab Results:  Recent Labs    07/14/17 0236 07/15/17 0351  WBC 23.7* 16.9*  HGB 15.2* 14.6  HCT 47.1* 45.8  PLT 225 221   BMET Recent Labs    07/14/17 0236 07/15/17 0351  NA 141 141  K 3.9 4.0  CL 102 100*  CO2 31 32  GLUCOSE 102* 99  BUN 17 16  CREATININE 0.31*  0.32* <0.30*  CALCIUM 9.0 8.9   PT/INR Recent Labs    07/12/17 1158  LABPROT 12.4  INR 0.93   ABG Recent Labs    07/12/17 1330 07/13/17 1040  PHART 7.449 7.440  HCO3 30.6* 32.2*    Studies/Results: Dg Chest Port 1 View  Result Date: 07/14/2017 CLINICAL DATA:  57 year old female with history of hypoxia. EXAM: PORTABLE CHEST 1 VIEW COMPARISON:  Chest x-ray 07/13/2017. FINDINGS: Lung volumes remain low. There continues to be extensive airspace consolidation throughout the left lower lobe (and to a lesser extent in the right lower lobe), with slight improved aeration overall compared to yesterday's examination. Small left pleural effusion. No pneumothorax. No evidence of pulmonary edema. Heart size appears normal. Upper mediastinal contours are within normal limits. IMPRESSION: 1. Slight improvement in left lower lobe pneumonia.  2. Atelectasis and/or consolidation in the right lower lobe, similar to the prior study. 3. Small left pleural effusion. Electronically Signed   By: Trudie Reed M.D.   On: 07/14/2017 07:23         LOS: 3 days    Bjorn Pippin 07/15/2017 [phone] ID: [id number] December P  Pricilla Holmucker, female   DOB: 05-31-1961, 57 y.o.   MRN: [id number]

## 2017-07-16 ENCOUNTER — Inpatient Hospital Stay (HOSPITAL_COMMUNITY): Payer: PPO

## 2017-07-16 DIAGNOSIS — Z515 Encounter for palliative care: Secondary | ICD-10-CM

## 2017-07-16 DIAGNOSIS — J189 Pneumonia, unspecified organism: Secondary | ICD-10-CM

## 2017-07-16 LAB — CBC WITH DIFFERENTIAL/PLATELET
BASOS PCT: 0 %
Basophils Absolute: 0 10*3/uL (ref 0.0–0.1)
EOS PCT: 0 %
Eosinophils Absolute: 0 10*3/uL (ref 0.0–0.7)
HEMATOCRIT: 48.1 % — AB (ref 36.0–46.0)
Hemoglobin: 15.8 g/dL — ABNORMAL HIGH (ref 12.0–15.0)
LYMPHS PCT: 10 %
Lymphs Abs: 1.4 10*3/uL (ref 0.7–4.0)
MCH: 29.4 pg (ref 26.0–34.0)
MCHC: 32.8 g/dL (ref 30.0–36.0)
MCV: 89.6 fL (ref 78.0–100.0)
Monocytes Absolute: 0.9 10*3/uL (ref 0.1–1.0)
Monocytes Relative: 6 %
NEUTROS ABS: 11.2 10*3/uL — AB (ref 1.7–7.7)
Neutrophils Relative %: 84 %
PLATELETS: 211 10*3/uL (ref 150–400)
RBC: 5.37 MIL/uL — AB (ref 3.87–5.11)
RDW: 14.9 % (ref 11.5–15.5)
WBC: 13.5 10*3/uL — AB (ref 4.0–10.5)

## 2017-07-16 LAB — BASIC METABOLIC PANEL
Anion gap: 9 (ref 5–15)
BUN: 16 mg/dL (ref 6–20)
CHLORIDE: 102 mmol/L (ref 101–111)
CO2: 30 mmol/L (ref 22–32)
Calcium: 9 mg/dL (ref 8.9–10.3)
Glucose, Bld: 92 mg/dL (ref 65–99)
Potassium: 3.9 mmol/L (ref 3.5–5.1)
SODIUM: 141 mmol/L (ref 135–145)

## 2017-07-16 LAB — PHOSPHORUS: PHOSPHORUS: 2.9 mg/dL (ref 2.5–4.6)

## 2017-07-16 LAB — MAGNESIUM: Magnesium: 1.9 mg/dL (ref 1.7–2.4)

## 2017-07-16 MED ORDER — AMLODIPINE BESYLATE 5 MG PO TABS: 5.0000 mg | ORAL_TABLET | Freq: Every day | ORAL | Status: DC

## 2017-07-16 MED ORDER — HYDRALAZINE HCL 20 MG/ML IJ SOLN
10.0000 mg | INTRAMUSCULAR | Status: DC | PRN
  Administered 2017-07-19 – 2017-07-21 (×2): 10 mg via INTRAVENOUS
  Filled 2017-07-16 (×2): qty 1

## 2017-07-16 MED ORDER — HYDRALAZINE HCL 20 MG/ML IJ SOLN: 10.0000 mg | INTRAMUSCULAR | Status: DC | PRN

## 2017-07-16 MED ORDER — LABETALOL HCL 5 MG/ML IV SOLN
10.0000 mg | INTRAVENOUS | Status: DC | PRN
  Administered 2017-07-19: 10 mg via INTRAVENOUS
  Filled 2017-07-16: qty 4

## 2017-07-16 NOTE — Consult Note (Signed)
Consultation Note Date: 07/16/2017   Patient Name: Tara Whitehead  DOB: Apr 29, 1961  MRN: [id number]  Age / Sex: 57 y.o., female  PCP: Tara Kiel, MD Referring Physician: Elodia Florence., *  Reason for Consultation: Establishing goals of care  HPI/Patient Profile: 57 y.o. female  with past medical history of ALS, anxiety, depression, HTN, arthritis, s/p knee replacements admitted on 07/11/2017 with SOB and HTN at 1 day postop suprapubic catheter placement and intravesical botox injection. She has continued to struggle with dependence on BiPAP and breaks with HFNC 10L, weak cough, unable to have intake. Palliative care requested to assist with Tara Whitehead conversations regarding desires for trach/PEG if she continues to decline.   Clinical Assessment and Goals of Care: I met today with Tara Whitehead (who was very lethargic and unable to participate much in our conversation) and her husband, Tara Whitehead, at bedside. I also spoke with close friend, Tara Whitehead, over the phone in the room with Tara Whitehead. Tara Whitehead is an Therapist, sports and Tara Whitehead and Tara Whitehead rely heavily on her to help guide and explain medical information to them. Tara Whitehead shares that we are allowed to discuss and provide Tara Whitehead with any and all information.   Tara Whitehead and Tara Whitehead share that [name] has in the past NOT desired trach/PEG. Tara Whitehead explains that she is reconsidering solely because she does not want to die before her mother. She does not want to put her mother through the anguish of losing a child at the end of her mother's life. They both share that Tara Whitehead continues to refuse feeding tube. I did explain that unfortunately she will likely need a feeding tube with trach and that these typically are "a package deal." Explained that it takes less effort to breathe than to eat/swallow so if she cannot breathe and requires trach she will likely need PEG. We also explored the thought that it may also  disturb her mother to see her daughter kept alive with machines and tubes.   Tara Whitehead says that she has been speaking with Tara Whitehead and Tara Whitehead regarding these decisions and will continue to help them and is very happy that palliative care is involved as well. I will follow up in the morning as Tara Whitehead says she is normally better rested and more interactive in the mornings. Emotional support provided to Tara Whitehead.   Primary Decision Maker PATIENT and then her husband    SUMMARY OF RECOMMENDATIONS   - Ethelreda is very lethargic and unable to participate in our conversation - Will revisit in am when she is typically more alert  Code Status/Advance Care Planning:  Full code   Symptom Management:   Anxiety: Ativan 0.5-1 mg IV every 4 hours prn.   SOB: Per PCCM. BiPAP, nebs, chest PT.  SLP following for window to further assess swallow safety.   Palliative Prophylaxis:   Aspiration, Bowel Regimen, Delirium Protocol, Frequent Pain Assessment, Oral Care and Turn Reposition  Additional Recommendations (Limitations, Scope, Preferences):  Full Scope Treatment  Psycho-social/Spiritual:   Desire for further Chaplaincy support:yes  Additional  Recommendations: Caregiving  Support/Resources, Education on Hospice and Grief/Bereavement Support  Prognosis:   Overall prognosis remains poor with progressing ALS. Short term prognosis dependent on aggressiveness of care.   Discharge Planning: To Be Determined      Primary Diagnoses: Present on Admission: . Neurogenic bladder disorder   I have reviewed the medical record, interviewed the patient and family, and examined the patient. The following aspects are pertinent.  Past Medical History:  Diagnosis Date  . ALS (amyotrophic lateral sclerosis) ([place]) 11/23/2016  . Anxiety   . Arthritis   . Complication of anesthesia    unable to do spinal at one of surgeries   . Depression   . Dyspnea    due to ALS   . Hypertension   . Neuromuscular  disorder ([place])    ALS    Social History   Socioeconomic History  . Marital status: Married    Spouse name: None  . Number of children: None  . Years of education: None  . Highest education level: None  Social Needs  . Financial resource strain: None  . Food insecurity - worry: None  . Food insecurity - inability: None  . Transportation needs - medical: None  . Transportation needs - non-medical: None  Occupational History  . None  Tobacco Use  . Smoking status: Current Every Day Smoker    Packs/day: 1.00    Types: Cigarettes  . Smokeless tobacco: Never Used  Substance and Sexual Activity  . Alcohol use: No    Frequency: Never  . Drug use: No  . Sexual activity: None  Other Topics Concern  . None  Social History Narrative  . None   History reviewed. No pertinent family history. Scheduled Meds: . amLODipine  5 mg Oral Daily  . buPROPion  150 mg Oral Daily  . docusate sodium  100 mg Oral BID  . enoxaparin (LOVENOX) injection  60 mg Subcutaneous Q24H  . mouth rinse  15 mL Mouth Rinse BID  . mouth rinse  15 mL Mouth Rinse q12n4p  . nicotine  14 mg Transdermal Daily  . oxybutynin  10 mg Oral Daily  . sodium chloride HYPERTONIC  4 mL Nebulization BID   Continuous Infusions: . ceFEPime (MAXIPIME) IV Stopped (07/16/17 1506)   PRN Meds:.acetaminophen, albuterol, belladonna-opium, clotrimazole, hydrALAZINE, labetalol, LORazepam, ondansetron, oxyCODONE, triamcinolone cream Allergies  Allergen Reactions  . Chlorhexidine Gluconate Itching and Rash    CHG WIPES---HIBICLENS WIPES   Review of Systems  Unable to perform ROS: Acuity of condition    Physical Exam  Constitutional: She appears well-developed. She appears lethargic. She has a sickly appearance. She appears ill.  HENT:  Head: Normocephalic and atraumatic.  Cardiovascular: Tachycardia present.  Pulmonary/Chest: She has decreased breath sounds.  Abdominal: Soft. Normal appearance.  Neurological: She appears  lethargic.  Nursing note and vitals reviewed.   Vital Signs: BP (!) 161/84   Pulse 83   Temp 98.1 F (36.7 C) (Axillary)   Resp (!) 22   Ht '5\' 11"'$  (1.803 m)   Wt 117.9 kg (260 lb)   SpO2 92%   BMI 36.26 kg/m  Pain Assessment: No/denies pain POSS *See Group Information*: 1-Acceptable,Awake and alert Pain Score: 3    SpO2: SpO2: 92 % O2 Device:SpO2: 92 % O2 Flow Rate: .O2 Flow Rate (L/min): 10 L/min  IO: Intake/output summary:   Intake/Output Summary (Last 24 hours) at 07/16/2017 1652 Last data filed at 07/16/2017 1506 Gross per 24 hour  Intake 250 ml  Output  925 ml  Net -675 ml    LBM: Last BM Date: 07/09/17 Baseline Weight: Weight: 117.9 kg (260 lb) Most recent weight: Weight: 117.9 kg (260 lb)     Palliative Assessment/Data: 20%    Time Total: 75 min  Greater than 50%  of this time was spent counseling and coordinating care related to the above assessment and plan.  Signed by: Vinie Sill, NP Palliative Medicine Team Pager # [phone] (M-F 8a-5p) Team Phone # [phone] (Nights/Weekends)

## 2017-07-16 NOTE — Progress Notes (Signed)
Patient has a weak cough and has not been able to clear secretions. This morning patient had been placed on HFNC @ 10 L and was sating at 92 %. But eventually patient started tiring out and was placed on BiPAP.  Patient had been receiving oral meds the day before. This RN has withheld PO meds due to concerns regarding aspiration. Bedside swallow eval done and possible MBS tomorrow. In the interim, it has been recommended by SLP to stick to administering only  tsps of water with a spoon.

## 2017-07-16 NOTE — Evaluation (Signed)
Clinical/Bedside Swallow Evaluation Patient Details  Name: Tara Whitehead MRN: [id number] Date of Birth: April 16, 1961  Today's Date: 07/16/2017 Time: SLP Start Time (ACUTE ONLY): 1230 SLP Stop Time (ACUTE ONLY): 1317 SLP Time Calculation (min) (ACUTE ONLY): 47 min  Past Medical History:  Past Medical History:  Diagnosis Date  . ALS (amyotrophic lateral sclerosis) ([facility]) 11/23/2016  . Anxiety   . Arthritis   . Complication of anesthesia    unable to do spinal at one of surgeries   . Depression   . Dyspnea    due to ALS   . Hypertension   . Neuromuscular disorder ([facility])    ALS    Past Surgical History:  Past Surgical History:  Procedure Laterality Date  . APPENDECTOMY    . BILATERAL CARPAL TUNNEL RELEASE    . BOTOX INJECTION N/A 07/11/2017   Procedure: BOTOX INJECTION;  Surgeon: Bjorn PippinWrenn, John, MD;  Location: [facility];  Service: Urology;  Laterality: N/A;  . INSERTION OF SUPRAPUBIC CATHETER N/A 07/11/2017   Procedure: INSERTION OF SUPRAPUBIC CATHETER;  Surgeon: Bjorn PippinWrenn, John, MD;  Location: [facility];  Service: Urology;  Laterality: N/A;  . JOINT REPLACEMENT     3 knee replacements   HPI:  57 yo female [facility].   Pt required Bipap and some discussion re: possible trach/PEG took place.  Today pt is on high flow oxygen and has been off Bipap.  Swallow eval ordered.    Assessment / Plan / Recommendation Clinical Impression  Pt presents with lingual fasciulations, weak cough and voice and mildly congested breathing - concerning for secretion aspiration.   Pt reports being unable to cough and clear secretions from her airway despite having chest PT.    Provided pt with water via straw, water via tsp and single ice chips only to determine readiness for clinical eval.  Pt presents with very weak cough with and without po - however increased incidence post-intake.  Of note, pt did become fatigued during evaluation over 20 minutes - resulting in increased  work of breathing and inhalation after speaking/swallow.  Pt will need an instrumental swallow evaluation before starting po due to silent aspiration risk and gross weakness/cough.    At this time, pt is fatigued and is just off bipap today - therefore recommend npo x tsps water and alternative means for medications.  MBS next date with hopeful improved respiratory status.  Pt/aunt Shirely educated to recommendations/clinical reasoning to which they both understand.   SLP Visit Diagnosis: Dysphagia, oropharyngeal phase (R13.12)    Aspiration Risk  Severe aspiration risk;Risk for inadequate nutrition/hydration    Diet Recommendation NPO(tsps water, medicine via alternative means)   Liquid Administration via: Spoon Medication Administration: Via alternative means Supervision: Staff to assist with self feeding Postural Changes: Seated upright at 90 degrees;Remain upright for at least 30 minutes after po intake    Other  Recommendations Oral Care Recommendations: Oral care BID   Follow up Recommendations Other (comment)(tbd)      Frequency and Duration min 2x/week  2 weeks       Prognosis Prognosis for Safe Diet Advancement: Fair Barriers to Reach Goals: Severity of deficits;Other (Comment)(progressive neurological disease)      Swallow Study   General Date of Onset: 07/16/17 HPI: 57 yo female with ALS [facility].   Pt required Bipap and some discussion re: possible trach/PEG took place.  Today pt is on high flow oxygen and has been off  Bipap.  Swallow eval ordered.  Type of Study: Bedside Swallow Evaluation Previous Swallow Assessment: none in chart, pt denies h/o eval - pt has been receiving po medications with water per her statement  Diet Prior to this Study: NPO Respiratory Status: Other (comment)(high flow oxygen) Behavior/Cognition: Alert;Cooperative Oral Cavity Assessment: Dry;Other (comment)(minimal amount of frothy secretions present which pt  expectorates with tsps water) Oral Cavity - Dentition: Adequate natural dentition Vision: Functional for self-feeding Self-Feeding Abilities: Needs assist Patient Positioning: Upright in bed Baseline Vocal Quality: Low vocal intensity Volitional Cough: Weak;Congested Volitional Swallow: Able to elicit    Oral/Motor/Sensory Function Overall Oral Motor/Sensory Function: Generalized oral weakness(lingual fasciculations apparent)   Ice Chips Ice chips: Impaired Presentation: Spoon Pharyngeal Phase Impairments: Cough - Delayed   Thin Liquid Thin Liquid: Impaired Presentation: Straw;Spoon Pharyngeal  Phase Impairments: Cough - Delayed    Nectar Thick Nectar Thick Liquid: Not tested   Honey Thick Honey Thick Liquid: Not tested   Puree Puree: Not tested   Solid   GO   Solid: Not tested        Chales Abrahams 07/16/2017,1:30 PM  Donavan Burnet, MS [facility] [address] SLP [phone]

## 2017-07-16 NOTE — Progress Notes (Signed)
Pt is awake, watching tv, found on PRB.  HR97, rr21, spo2 96%.  No increased wob/respiratory distress noted or voiced by pt at this time.  Pt prefers to stay on prb mask, not bipap at this time.  Bipap remains in room on standby.

## 2017-07-16 NOTE — Progress Notes (Signed)
PULMONARY / CRITICAL CARE MEDICINE   Name: Tara Whitehead MRN: [id number] DOB: [birth year]    ADMISSION DATE:  07/11/2017 CONSULTATION DATE:  07/12/17  REFERRING MD:  Drs Lowell Guitar, Dr Annabell Howells  CHIEF COMPLAINT:  Acute resp failure  BRIEF:   57 year old woman with a history of ALS followed at [facility]  She has a history of dyspnea both at rest and with her ADLs.  She was under evaluation for nocturnal ventilation, Trilogy device based on office note from 06/26/17.  Also cough assistance device and suctioning at home.  She underwent cystoscopy for suprapubic catheter placement and Botox instillation to treat small, neurogenic bladder.  Postprocedural course has been complicated by dyspnea, hypoxemia, increased work of breathing and some left-sided chest discomfort.  She has been hypertensive post procedure.  1/18 she developed acute worsening of her dyspnea, placed on BiPAP and moved to the ICU.  Chest x-ray is rotated but shows significant left-sided volume loss, possible associated effusion, with elevated left hemidiaphragm (which is still visible).  She received empiric Lasix, was started on empiric broad-spectrum antibiotics prior to transfer.  Of note she was treated recently with a short course of antibiotics and a prednisone taper in the setting of an apparent upper respiratory infection, question whether this is evolved into pneumonia.  At the least it may have influenced her secretions and affected her secretion clearance, left-sided atelectasis    CULTURES: Blood 1/18 >>   ANTIBIOTICS: Cefepime 1/18 >> Vancomycin 1/18 >> 1/21  SIGNIFICANT EVENTS: Cystoscopy, suprapubic catheter placement, Botox instillation into the bladder 1/18; Chest x-ray 1/18 >> left lower lobe atelectasis versus infiltrate with possible associated effusion  07/13/2017 - desatn on bipap   SUBJECTIVE/OVERNIGHT/INTERVAL HX  Afebrile Less resp distress, off bipap on HFNC this am Able to speak in short  sentences  VITAL SIGNS: BP (!) 198/73   Pulse 83   Temp 98 F (36.7 C) (Axillary)   Resp (!) 22   Ht 5\' 11"  (1.803 m)   Wt 260 lb (117.9 kg)   SpO2 93%   BMI 36.26 kg/m   HEMODYNAMICS:    VENTILATOR SETTINGS: FiO2 (%):  [60 %] 60 %  INTAKE / OUTPUT: I/O last 3 completed shifts: In: 475 [P.O.:275; IV Piggyback:200] Out: 1975 [Urine:1975]  PHYSICAL EXAMINATION:   General Appearance:    Looks criticall ill OBESE   Head:    Normocephalic, without obvious abnormality, atraumatic  Eyes:    PERRL - yes, conjunctiva/corneas - clear      Ears:    Normal external ear canals, both ears        Neck:   Supple,  No enlargement/tenderness/nodules     Lungs:     Clear to auscultation, bilaterally decreased air movement at bases  Chest wall:    No deformity  Heart:    S1 and S2 normal, no murmur, CVP - no.  Pressors - no  Abdomen:     Soft, no masses, no organomegaly  Genitalia:    Not done  Rectal:   not done  Extremities:   Extremities- intact     Skin:   Intact in exposed areas .     Neurologic:  able to communicate by writing, wasting of hand muscles     PULMONARY Recent Labs  Lab 07/12/17 1330 07/13/17 1040  PHART 7.449 7.440  PCO2ART 44.8 48.2*  PO2ART 61.8* 91.5  HCO3 30.6* 32.2*  O2SAT 91.7 96.6    CBC Recent Labs  Lab 07/14/17 0236 07/15/17 0351  07/16/17 0324  HGB 15.2* 14.6 15.8*  HCT 47.1* 45.8 48.1*  WBC 23.7* 16.9* 13.5*  PLT 225 221 211    COAGULATION Recent Labs  Lab 07/12/17 1158  INR 0.93    CARDIAC   Recent Labs  Lab 07/12/17 1000 07/12/17 1527 07/12/17 2154  TROPONINI <0.03 <0.03 <0.03   No results for input(s): PROBNP in the last 168 hours.   CHEMISTRY Recent Labs  Lab 07/12/17 1158 07/13/17 0316 07/14/17 0236 07/15/17 0351 07/16/17 0324  NA 139 136 141 141 141  K 3.4* 3.4* 3.9 4.0 3.9  CL 102 101 102 100* 102  CO2 27 27 31  32 30  GLUCOSE 116* 140* 102* 99 92  BUN 13 17 17 16 16   CREATININE <0.30* <0.30*  0.31*  0.32* <0.30* <0.30*  CALCIUM 8.9 8.7* 9.0 8.9 9.0  MG  --  1.8 2.0 1.8 1.9  PHOS  --   --  2.4* 2.7 2.9   CrCl cannot be calculated (This lab value cannot be used to calculate CrCl because it is not a number: <0.30).   LIVER Recent Labs  Lab 07/12/17 1158 07/15/17 0351  AST 19 13*  ALT 22 13*  ALKPHOS 95 74  BILITOT 0.5 1.0  PROT 7.7 6.7  ALBUMIN 3.8 3.0*  INR 0.93  --      INFECTIOUS Recent Labs  Lab 07/12/17 1158 07/12/17 1527  07/13/17 1134 07/14/17 0236 07/15/17 0351  LATICACIDVEN 1.1 1.7  --   --   --   --   PROCALCITON <0.10  --    < > 0.54 0.38 0.18   < > = values in this interval not displayed.     ENDOCRINE CBG (last 3)  Recent Labs    07/14/17 0824  GLUCAP 99         IMAGING x48h  - image(s) personally visualized  -   highlighted in bold Dg Chest Port 1 View  Result Date: 07/16/2017 CLINICAL DATA:  Respiratory failure, acute on chronic, current smoker. EXAM: PORTABLE CHEST 1 VIEW COMPARISON:  Portable chest x-ray of July 15, 2017 FINDINGS: The lungs are adequately inflated. Bibasilar densities are present and have increased on the right though are stable on the left. The right hemidiaphragm is now obscured. The cardiac silhouette is normal in size. The pulmonary vascularity is not clearly engorged. The mediastinum is normal in width. The observed bony thorax is unremarkable. IMPRESSION: Worsening bibasilar atelectasis or pneumonia. Probable small left pleural effusion. No pulmonary edema. Electronically Signed   By: David  [place] M.D.   On: 07/16/2017 07:26   Dg Chest Port 1 View  Result Date: 07/15/2017 CLINICAL DATA:  Hypoxia EXAM: PORTABLE CHEST 1 VIEW COMPARISON:  Chest x-ray of July 14, 2017 FINDINGS: The right lung is adequately inflated. On the left there is persistent density at the lung base which has not greatly changed. There is no pneumothorax. The heart and pulmonary vascularity are normal. The mediastinum is normal in  width. The bony thorax is unremarkable. IMPRESSION: Persistent left lower lobe atelectasis or pneumonia and small effusion. Minimal right basilar subsegmental atelectasis. No CHF. Electronically Signed   By: David  [place] M.D.   On: 07/15/2017 08:09   Anti-infectives (From admission, onward)   Start     Dose/Rate Route Frequency Ordered Stop   07/12/17 2200  vancomycin (VANCOCIN) IVPB 750 mg/150 ml premix  Status:  Discontinued     750 mg 150 mL/hr over 60 Minutes Intravenous Every 12 hours 07/12/17 1221  07/14/17 1003   07/12/17 2200  ceFEPIme (MAXIPIME) 1 g in dextrose 5 % 50 mL IVPB     1 g 100 mL/hr over 30 Minutes Intravenous Every 8 hours 07/12/17 1221     07/12/17 1300  ceFEPIme (MAXIPIME) 2 g in dextrose 5 % 50 mL IVPB     2 g 100 mL/hr over 30 Minutes Intravenous  Once 07/12/17 1144 07/12/17 1522   07/12/17 1300  vancomycin (VANCOCIN) 2,000 mg in sodium chloride 0.9 % 500 mL IVPB     2,000 mg 250 mL/hr over 120 Minutes Intravenous  Once 07/12/17 1144 07/12/17 1633   07/12/17 1000  doxycycline (VIBRA-TABS) tablet 100 mg  Status:  Discontinued     100 mg Oral Every 12 hours 07/12/17 0946 07/12/17 1229   07/11/17 1800  ceFAZolin (ANCEF) IVPB 2g/100 mL premix     2 g 200 mL/hr over 30 Minutes Intravenous Every 8 hours 07/11/17 1448 07/12/17 0340   07/11/17 0700  ceFAZolin (ANCEF) IVPB 2g/100 mL premix     2 g 200 mL/hr over 30 Minutes Intravenous 30 min pre-op 07/11/17 0700 07/11/17 0915       LINES/TUBES:   DISCUSSION:  ASSESSMENT / PLAN:  PULMONARY A: Acute on chronic respiratory failure due to ALS, possibly exacerbated by her recent procedure/sedation with resultant left-sided atelectasis.  & left-sided pneumonia.   She does have chronic left-sided weakness and may have chronically abnormal left hemidiaphragm excursion  1/22 improved  On HFNC P:   Continue BiPAP qhs , prn daytime, use HFNC instead Aggressive chest PT, chest vest Incentive  spirometry   CARDIOVASCULAR A:  Hypertension P:   hydralazine,labetalol prn Restart PO amlodipin   RENAL / UROLOGICAL A:   Hypokalemia Neurogenic bladder status post suprapubic catheter, Botox instillation 1/17 P:   Follow urine output, BMP, replace electrolytes as indicated Suprapubic catheter management per urology    GASTROINTESTINAL A:   SUP Constipation Protein calorie malnutrition,mild P:   Add PPI Swallow eval & advance Dulcolax prn suppository  HEMATOLOGIC A:   Acute leukocytosis,  due to left-sided pneumonia , improved P:  Follow CBC  INFECTIOUS A:   Left sided infiltrate, possible HCAP - PCT decreasing P:   Dc  empiric vancomycin  cefepime x 7-10 ds Follow blood cultures   ENDOCRINE A:   At risk hyperglycemia on corticosteroids P:   Sliding scale insulin per protocol  NEUROLOGIC A:   [facility] Anxiety P:   Wellbutrin and Effexor Ativan prn PT eval   FAMILY  - Updates: 1/21 Extensive discussion with patient and her husband regarding goals of care.  Complicated situation :her mother -has severe multiple sclerosis with stage IV decub and is a nursing home and expected to die soon.  Patient does not want to pass before her mother.  She never wanted tracheostomy or feeding tube but did not expect that her condition  would deteriorate so soon. now considering  - Inter-disciplinary family meet or Palliative Care meeting due by:  07/19/16    Comer Locketakesh V. Vassie LollAlva MD  07/16/2017 9:36 AM

## 2017-07-16 NOTE — Progress Notes (Signed)
Palliative Medicine consult noted. Due to high referral volume, there may be a delay seeing this patient. Please call the Palliative Medicine Team office at [phone] if recommendations are needed in the interim.  Thank you for inviting us to see this patient.  Margret ChanceMelanie G. Kasaundra Fahrney, RN, BSN, [facility] Palliative Medicine Team 07/16/2017 12:29 PM Office [phone]

## 2017-07-16 NOTE — Progress Notes (Addendum)
PROGRESS NOTE    Tara Whitehead  ZOX:[id number] DOB: 1961-05-10 DOA: 07/11/2017 PCP: Philemon Kingdom, MD   Brief Narrative:  Tara Whitehead is an 57 y.o. female 57 y.o.female with medical history significant for not limited to morbid obesity, hypertension amyotrophic lateral sclerosis, POD1 s/p intravesical botox injection and suprapubic tube placement.   We were asked to see her on 1/18 due to increased work of breathing and HTN this AM.   She had apparently been doing well until this 1/18 when she was noted to be having some shortness of breath with increased work of breathing and was started on oxygen by nasal cannula and we are consulted for further management.  Patient notes that her sibling having problems with her breathing over the last 6 months and indeed her "ALS doctor" was planning to put her on BiPAP.  However 5 days ago, on Monday she started having some problems of cough loss of breath saw her PCP who started her on steroid tablets with Z-Pak which is about completed.  She admits to chills without fever and had actually been feeling better without any respiratory distress at time of my evaluation after initiation of supplemental oxygen and able to complete full sentences.  She admits to left-sided mild aching chest pain which is nonradiating lasting for a few minutes this morning about 3-4/10.  Her shortness of breath is better and denies any dizziness but admits to being fatigued a little bit this morning.  She denies any palpitations or diaphoresis, no paroxysmal nocturnal dyspnea, and no dizziness.  Assessment & Plan:   Active Problems:   Neurogenic bladder disorder   Pressure injury of skin   SOB (shortness of breath)   Acute on chronic respiratory failure with hypoxia (HCC)  Acute Hypoxic Respiratory Failure:  Likely 2/2 HCAP.  WBC count improving today.  CXR with slight improvement in LLL pneumonia, also atelectasis vs consolidation in RLL.  She was notably a  difficult intubation.  With hx of ALS and reported L sided weakness, possibly some diaphragmatic weakness on that side, also effects of botox on differential, but with elevated WBC count, infectious most likely. CXR from 1/21 with worsening bibasilar atelectasis or pneumonia, probable small L pleural effusion.  No pulm edema.  Clinically looking better today, she's on NRB this morning, still with increased WOB, but improved.  vanc (1/18-1/20) cefepime (1/18- ).  Plan for 7-10 day course.  Chest PT, IS Procalcitonin improving Continue on bipap as needed.  Hopefully can have longer breaks off bipap today and possibly take some PO as well.   Bcx NGTDx3 Sputum cx if able Negative troponins Echo with grade 1 diastolic dysfunction, normal EF Appreciate assistance of PCCM Some concern for aspiration with sips with meds, will order SLP eval when able to be done (pt on bipap majority of past few days) - addendum - per speech, pt to be strict NPO, only sips of water with spoon.  No meds.  Plan for MBS.   Hypertension:  Prn labetalol and hydral ordered  Small capacity bladder with incontinence: s/p suprapubic catheter placement and botox by urology oyxbutinin  ALS: follows at [facility]  Depression  Anxiety: wellbutrin/effexor - has ativan ordered prn  Leukocytosis: improving, likely 2/2 infection above  Hypokalemia: replete prn, follow mag  Pt to be transferred to [facility] service as primary from urology (on 07/15/17)  DVT prophylaxis: lovenox Code Status: full  Family Communication: husband at bedsdie Disposition Plan: pending improvement in resp status  Consultants:   PCCM  Procedures:  Echo 1/18 Study Conclusions  - Left ventricle: The cavity size was normal. There was severe   focal basal hypertrophy. Systolic function was normal. The   estimated ejection fraction was in the range of 60% to 65%. Wall   motion was normal; there were no regional wall motion   abnormalities. There was  an increased relative contribution of   atrial contraction to ventricular filling. Doppler parameters are   consistent with abnormal left ventricular relaxation (grade 1   diastolic dysfunction). Doppler parameters are consistent with   high ventricular filling pressure. - Aortic valve: Mild calcification. - Mitral valve: Calcified annulus. Mild calcification of the   anterior leaflet. - Pericardium, extracardiac: A trivial pericardial effusion was   identified.   Antimicrobials:  Anti-infectives (From admission, onward)   Start     Dose/Rate Route Frequency Ordered Stop   07/12/17 2200  vancomycin (VANCOCIN) IVPB 750 mg/150 ml premix  Status:  Discontinued     750 mg 150 mL/hr over 60 Minutes Intravenous Every 12 hours 07/12/17 1221 07/14/17 1003   07/12/17 2200  ceFEPIme (MAXIPIME) 1 g in dextrose 5 % 50 mL IVPB     1 g 100 mL/hr over 30 Minutes Intravenous Every 8 hours 07/12/17 1221     07/12/17 1300  ceFEPIme (MAXIPIME) 2 g in dextrose 5 % 50 mL IVPB     2 g 100 mL/hr over 30 Minutes Intravenous  Once 07/12/17 1144 07/12/17 1522   07/12/17 1300  vancomycin (VANCOCIN) 2,000 mg in sodium chloride 0.9 % 500 mL IVPB     2,000 mg 250 mL/hr over 120 Minutes Intravenous  Once 07/12/17 1144 07/12/17 1633   07/12/17 1000  doxycycline (VIBRA-TABS) tablet 100 mg  Status:  Discontinued     100 mg Oral Every 12 hours 07/12/17 0946 07/12/17 1229   07/11/17 1800  ceFAZolin (ANCEF) IVPB 2g/100 mL premix     2 g 200 mL/hr over 30 Minutes Intravenous Every 8 hours 07/11/17 1448 07/12/17 0340   07/11/17 0700  ceFAZolin (ANCEF) IVPB 2g/100 mL premix     2 g 200 mL/hr over 30 Minutes Intravenous 30 min pre-op 07/11/17 0700 07/11/17 0915     Subjective: Feeling better. Wants to use her chair.   Objective: Vitals:   07/16/17 0343 07/16/17 0400 07/16/17 0500 07/16/17 0600  BP:  (!) 150/87 (!) 174/78 (!) 174/74  Pulse:      Resp:  (!) 23 (!) 22 (!) 22  Temp: 97.6 F (36.4 C)       TempSrc: Oral     SpO2:  97% 95% 95%  Weight:      Height:        Intake/Output Summary (Last 24 hours) at 07/16/2017 0747 Last data filed at 07/16/2017 0615 Gross per 24 hour  Intake 100 ml  Output 1975 ml  Net -1875 ml   Filed Weights   07/11/17 0720  Weight: 117.9 kg (260 lb)    Examination: General: No acute distress. Cardiovascular: Heart sounds show a regular rate, and rhythm. No gallops or rubs. No murmurs. No JVD. Lungs: Increased WOB, but improved from previous days.  Some rhonchi at the bases.   Abdomen: Soft, nontender, nondistended with normal active bowel sounds. No masses. No hepatosplenomegaly. Neurological: Alert and oriented 3. Cranial nerves II through XII grossly intact. Skin: Warm and dry. No rashes or lesions. Extremities: No clubbing or cyanosis. No edema.  Psychiatric: Mood and affect are normal.  Insight and judgment are appropriate.   Data Reviewed: I have personally reviewed following labs and imaging studies  CBC: Recent Labs  Lab 07/12/17 1158 07/13/17 0316 07/14/17 0236 07/15/17 0351 07/16/17 0324  WBC 28.6* 38.9* 23.7* 16.9* 13.5*  NEUTROABS  --   --  21.2* 14.4* 11.2*  HGB 16.9* 16.3* 15.2* 14.6 15.8*  HCT 49.3* 47.6* 47.1* 45.8 48.1*  MCV 86.6 87.0 88.0 90.0 89.6  PLT 257 255 225 221 211   Basic Metabolic Panel: Recent Labs  Lab 07/12/17 1158 07/13/17 0316 07/14/17 0236 07/15/17 0351 07/16/17 0324  NA 139 136 141 141 141  K 3.4* 3.4* 3.9 4.0 3.9  CL 102 101 102 100* 102  CO2 27 27 31  32 30  GLUCOSE 116* 140* 102* 99 92  BUN 13 17 17 16 16   CREATININE <0.30* <0.30* 0.31*  0.32* <0.30* <0.30*  CALCIUM 8.9 8.7* 9.0 8.9 9.0  MG  --  1.8 2.0 1.8 1.9  PHOS  --   --  2.4* 2.7 2.9   GFR: CrCl cannot be calculated (This lab value cannot be used to calculate CrCl because it is not a number: <0.30). Liver Function Tests: Recent Labs  Lab 07/12/17 1158 07/15/17 0351  AST 19 13*  ALT 22 13*  ALKPHOS 95 74  BILITOT 0.5 1.0   PROT 7.7 6.7  ALBUMIN 3.8 3.0*   No results for input(s): LIPASE, AMYLASE in the last 168 hours. No results for input(s): AMMONIA in the last 168 hours. Coagulation Profile: Recent Labs  Lab 07/12/17 1158  INR 0.93   Cardiac Enzymes: Recent Labs  Lab 07/12/17 1000 07/12/17 1527 07/12/17 2154  TROPONINI <0.03 <0.03 <0.03   BNP (last 3 results) No results for input(s): PROBNP in the last 8760 hours. HbA1C: No results for input(s): HGBA1C in the last 72 hours. CBG: Recent Labs  Lab 07/14/17 0824  GLUCAP 99   Lipid Profile: No results for input(s): CHOL, HDL, LDLCALC, TRIG, CHOLHDL, LDLDIRECT in the last 72 hours. Thyroid Function Tests: No results for input(s): TSH, T4TOTAL, FREET4, T3FREE, THYROIDAB in the last 72 hours. Anemia Panel: No results for input(s): VITAMINB12, FOLATE, FERRITIN, TIBC, IRON, RETICCTPCT in the last 72 hours. Sepsis Labs: Recent Labs  Lab 07/12/17 1158 07/12/17 1527 07/13/17 0316 07/13/17 1134 07/14/17 0236 07/15/17 0351  PROCALCITON <0.10  --  0.63 0.54 0.38 0.18  LATICACIDVEN 1.1 1.7  --   --   --   --     Recent Results (from the past 240 hour(s))  Culture, blood (routine x 2)     Status: None (Preliminary result)   Collection Time: 07/12/17 11:55 AM  Result Value Ref Range Status   Specimen Description BLOOD RIGHT ANTECUBITAL  Final   Special Requests   Final    BOTTLES DRAWN AEROBIC AND ANAEROBIC Blood Culture adequate volume   Culture   Final    NO GROWTH 3 DAYS Performed at [facility], [address] [place], [place] [address]    Report Status PENDING  Incomplete  Culture, blood (routine x 2)     Status: None (Preliminary result)   Collection Time: 07/12/17 11:55 AM  Result Value Ref Range Status   Specimen Description BLOOD BLOOD LEFT HAND  Final   Special Requests IN PEDIATRIC BOTTLE Blood Culture adequate volume  Final   Culture   Final    NO GROWTH 3 DAYS Performed at [facility], [address] [place], [place] [address]  Report Status PENDING  Incomplete  MRSA PCR Screening     Status: None   Collection Time: 07/12/17 12:24 PM  Result Value Ref Range Status   MRSA by PCR NEGATIVE NEGATIVE Final    Comment:        The GeneXpert MRSA Assay (FDA approved for NASAL specimens only), is one component of a comprehensive MRSA colonization surveillance program. It is not intended to diagnose MRSA infection nor to guide or monitor treatment for MRSA infections.          Radiology Studies: Dg Chest Port 1 View  Result Date: 07/16/2017 CLINICAL DATA:  Respiratory failure, acute on chronic, current smoker. EXAM: PORTABLE CHEST 1 VIEW COMPARISON:  Portable chest x-ray of July 15, 2017 FINDINGS: The lungs are adequately inflated. Bibasilar densities are present and have increased on the right though are stable on the left. The right hemidiaphragm is now obscured. The cardiac silhouette is normal in size. The pulmonary vascularity is not clearly engorged. The mediastinum is normal in width. The observed bony thorax is unremarkable. IMPRESSION: Worsening bibasilar atelectasis or pneumonia. Probable small left pleural effusion. No pulmonary edema. Electronically Signed   By: David  Swaz[place]and M.D.   On: 07/16/2017 07:26   Dg Chest Port 1 View  Result Date: 07/15/2017 CLINICAL DATA:  Hypoxia EXAM: PORTABLE CHEST 1 VIEW COMPARISON:  Chest x-ray of July 14, 2017 FINDINGS: The right lung is adequately inflated. On the left there is persistent density at the lung base which has not greatly changed. There is no pneumothorax. The heart and pulmonary vascularity are normal. The mediastinum is normal in width. The bony thorax is unremarkable. IMPRESSION: Persistent left lower lobe atelectasis or pneumonia and small effusion. Minimal right basilar subsegmental atelectasis. No CHF. Electronically Signed   By: David  [place] M.D.   On: 07/15/2017 08:09        Scheduled Meds: . buPROPion  150 mg  Oral Daily  . docusate sodium  100 mg Oral BID  . enoxaparin (LOVENOX) injection  60 mg Subcutaneous Q24H  . mouth rinse  15 mL Mouth Rinse BID  . mouth rinse  15 mL Mouth Rinse q12n4p  . nicotine  14 mg Transdermal Daily  . oxybutynin  10 mg Oral Daily  . sodium chloride HYPERTONIC  4 mL Nebulization BID   Continuous Infusions: . ceFEPime (MAXIPIME) IV 1 g (07/16/17 0615)     LOS: 4 days    Time spent: over 30 min    Lacretia Nicks, MD [facility] Pager [phone]  If 7PM-7AM, please contact night-coverage [email or web address] Password [facility] 07/16/2017, 7:47 AM

## 2017-07-17 ENCOUNTER — Inpatient Hospital Stay (HOSPITAL_COMMUNITY): Payer: PPO

## 2017-07-17 DIAGNOSIS — Z7189 Other specified counseling: Secondary | ICD-10-CM

## 2017-07-17 DIAGNOSIS — Z515 Encounter for palliative care: Secondary | ICD-10-CM

## 2017-07-17 DIAGNOSIS — G1221 Amyotrophic lateral sclerosis: Secondary | ICD-10-CM

## 2017-07-17 LAB — COMPREHENSIVE METABOLIC PANEL
ALK PHOS: 69 U/L (ref 38–126)
ALT: 13 U/L — AB (ref 14–54)
ANION GAP: 10 (ref 5–15)
AST: 18 U/L (ref 15–41)
Albumin: 3 g/dL — ABNORMAL LOW (ref 3.5–5.0)
BILIRUBIN TOTAL: 0.5 mg/dL (ref 0.3–1.2)
BUN: 16 mg/dL (ref 6–20)
CALCIUM: 9.1 mg/dL (ref 8.9–10.3)
CO2: 31 mmol/L (ref 22–32)
Chloride: 103 mmol/L (ref 101–111)
GLUCOSE: 92 mg/dL (ref 65–99)
Potassium: 3.9 mmol/L (ref 3.5–5.1)
Sodium: 144 mmol/L (ref 135–145)
TOTAL PROTEIN: 7 g/dL (ref 6.5–8.1)

## 2017-07-17 LAB — CBC WITH DIFFERENTIAL/PLATELET
Basophils Absolute: 0 10*3/uL (ref 0.0–0.1)
Basophils Relative: 0 %
Eosinophils Absolute: 0 10*3/uL (ref 0.0–0.7)
Eosinophils Relative: 0 %
HEMATOCRIT: 52.6 % — AB (ref 36.0–46.0)
Hemoglobin: 16.6 g/dL — ABNORMAL HIGH (ref 12.0–15.0)
LYMPHS ABS: 1.6 10*3/uL (ref 0.7–4.0)
LYMPHS PCT: 13 %
MCH: 28.8 pg (ref 26.0–34.0)
MCHC: 31.6 g/dL (ref 30.0–36.0)
MCV: 91.3 fL (ref 78.0–100.0)
MONO ABS: 1.3 10*3/uL — AB (ref 0.1–1.0)
MONOS PCT: 10 %
NEUTROS ABS: 9.3 10*3/uL — AB (ref 1.7–7.7)
Neutrophils Relative %: 77 %
Platelets: 163 10*3/uL (ref 150–400)
RBC: 5.76 MIL/uL — ABNORMAL HIGH (ref 3.87–5.11)
RDW: 14.9 % (ref 11.5–15.5)
WBC: 12.2 10*3/uL — ABNORMAL HIGH (ref 4.0–10.5)

## 2017-07-17 LAB — CULTURE, BLOOD (ROUTINE X 2)
CULTURE: NO GROWTH
Culture: NO GROWTH
SPECIAL REQUESTS: ADEQUATE
SPECIAL REQUESTS: ADEQUATE

## 2017-07-17 LAB — PHOSPHORUS: Phosphorus: 3 mg/dL (ref 2.5–4.6)

## 2017-07-17 LAB — MAGNESIUM: Magnesium: 2 mg/dL (ref 1.7–2.4)

## 2017-07-17 MED ORDER — VENLAFAXINE HCL ER 75 MG PO CP24
225.0000 mg | ORAL_CAPSULE | Freq: Every day | ORAL | Status: DC
  Administered 2017-07-18 – 2017-07-23 (×6): 225 mg via ORAL
  Filled 2017-07-17 (×7): qty 1

## 2017-07-17 MED ORDER — SODIUM CHLORIDE 0.9 % IV SOLN
INTRAVENOUS | Status: AC
  Administered 2017-07-17: 21:00:00 via INTRAVENOUS

## 2017-07-17 MED ORDER — BUPROPION HCL ER (XL) 150 MG PO TB24
150.0000 mg | ORAL_TABLET | Freq: Every day | ORAL | Status: DC
  Administered 2017-07-18 – 2017-07-23 (×6): 150 mg via ORAL
  Filled 2017-07-17 (×6): qty 1

## 2017-07-17 MED ORDER — AMLODIPINE BESYLATE 5 MG PO TABS
5.0000 mg | ORAL_TABLET | Freq: Every day | ORAL | Status: DC
  Administered 2017-07-18: 5 mg via ORAL
  Filled 2017-07-17: qty 1

## 2017-07-17 NOTE — Progress Notes (Signed)
PULMONARY / CRITICAL CARE MEDICINE   Name: Tara Whitehead MRN: [id number] DOB: 1961-05-25    ADMISSION DATE:  07/11/2017 CONSULTATION DATE:  07/12/17  REFERRING MD:  Drs Lowell Guitar, Dr Annabell Howells  CHIEF COMPLAINT:  Acute resp failure  BRIEF:   57 year old woman with a history of ALS followed at [facility].  She has a history of dyspnea both at rest and with her ADLs.  She was under evaluation for nocturnal ventilation, Trilogy device based on office note from 06/26/17.  Also cough assistance device and suctioning at home.  She underwent cystoscopy for suprapubic catheter placement and Botox instillation to treat small, neurogenic bladder.  Postprocedural course has been complicated by dyspnea, hypoxemia, increased work of breathing and some left-sided chest discomfort.  She has been hypertensive post procedure.  1/18 she developed acute worsening of her dyspnea, placed on BiPAP and moved to the ICU.  Chest x-ray is rotated but shows significant left-sided volume loss, possible associated effusion, with elevated left hemidiaphragm (which is still visible).  She received empiric Lasix, was started on empiric broad-spectrum antibiotics prior to transfer.  Of note she was treated recently with a short course of antibiotics and a prednisone taper in the setting of an apparent upper respiratory infection, question whether this is evolved into pneumonia.  At the least it may have influenced her secretions and affected her secretion clearance, left-sided atelectasis    CULTURES: Blood 1/18 >>  ng  ANTIBIOTICS: Cefepime 1/18 >> Vancomycin 1/18 >> 1/21  SIGNIFICANT EVENTS: Cystoscopy, suprapubic catheter placement, Botox instillation into the bladder 1/18; Chest x-ray 1/18 >> left lower lobe atelectasis versus infiltrate with possible associated effusion  07/13/2017 - desatn on bipap   SUBJECTIVE/OVERNIGHT/INTERVAL HX  Came off BiPAP 1/20 2 AM but went back on it in the afternoon and stayed on  overnight. This a.m. tolerating 10 L high flow nasal cannula PT was able to get her out of bed into her wheelchair   VITAL SIGNS: BP (!) 164/79   Pulse (!) 116   Temp (!) 97.5 F (36.4 C) (Axillary)   Resp (!) 28   Ht 5\' 11"  (1.803 m)   Wt 260 lb (117.9 kg)   SpO2 100%   BMI 36.26 kg/m   HEMODYNAMICS:    VENTILATOR SETTINGS: FiO2 (%):  [60 %] 60 %  INTAKE / OUTPUT: I/O last 3 completed shifts: In: 360 [Other:110; IV Piggyback:250] Out: 1625 [Urine:1625]  PHYSICAL EXAMINATION:   General Appearance:    Looks criticall ill OBESE   Head:    Normocephalic, without obvious abnormality, atraumatic  Eyes:    PERRL - yes, conjunctiva/corneas - clear      Ears:    Normal external ear canals, both ears        Neck:   Supple,  No enlargement/tenderness/nodules     Lungs:     Clear to auscultation, bilaterally decreased air movement at bases  Chest wall:    No deformity  Heart:    S1 and S2 normal, no murmur, CVP - no.  Pressors - no  Abdomen:     Soft, no masses, no organomegaly  Genitalia:    Not done  Rectal:   not done  Extremities:   Extremities- intact     Skin:   Intact in exposed areas .     Neurologic:  able to talk in short sentences, wasting of hand muscles     PULMONARY Recent Labs  Lab 07/12/17 1330 07/13/17 1040  PHART 7.449 7.440  PCO2ART 44.8 48.2*  PO2ART 61.8* 91.5  HCO3 30.6* 32.2*  O2SAT 91.7 96.6    CBC Recent Labs  Lab 07/15/17 0351 07/16/17 0324 07/17/17 0330  HGB 14.6 15.8* 16.6*  HCT 45.8 48.1* 52.6*  WBC 16.9* 13.5* 12.2*  PLT 221 211 163    COAGULATION Recent Labs  Lab 07/12/17 1158  INR 0.93    CARDIAC   Recent Labs  Lab 07/12/17 1000 07/12/17 1527 07/12/17 2154  TROPONINI <0.03 <0.03 <0.03   No results for input(s): PROBNP in the last 168 hours.   CHEMISTRY Recent Labs  Lab 07/13/17 0316 07/14/17 0236 07/15/17 0351 07/16/17 0324 07/17/17 0330  NA 136 141 141 141 144  K 3.4* 3.9 4.0 3.9 3.9  CL  101 102 100* 102 103  CO2 27 31 32 30 31  GLUCOSE 140* 102* 99 92 92  BUN 17 17 16 16 16   CREATININE <0.30* 0.31*  0.32* <0.30* <0.30* <0.30*  CALCIUM 8.7* 9.0 8.9 9.0 9.1  MG 1.8 2.0 1.8 1.9 2.0  PHOS  --  2.4* 2.7 2.9 3.0   CrCl cannot be calculated (This lab value cannot be used to calculate CrCl because it is not a number: <0.30).   LIVER Recent Labs  Lab 07/12/17 1158 07/15/17 0351 07/17/17 0330  AST 19 13* 18  ALT 22 13* 13*  ALKPHOS 95 74 69  BILITOT 0.5 1.0 0.5  PROT 7.7 6.7 7.0  ALBUMIN 3.8 3.0* 3.0*  INR 0.93  --   --      INFECTIOUS Recent Labs  Lab 07/12/17 1158 07/12/17 1527  07/13/17 1134 07/14/17 0236 07/15/17 0351  LATICACIDVEN 1.1 1.7  --   --   --   --   PROCALCITON <0.10  --    < > 0.54 0.38 0.18   < > = values in this interval not displayed.     ENDOCRINE CBG (last 3)  No results for input(s): GLUCAP in the last 72 hours.       IMAGING x48h  - image(s) personally visualized  -   highlighted in bold Dg Chest Port 1 View  Result Date: 07/17/2017 CLINICAL DATA:  Hypoxia EXAM: PORTABLE CHEST 1 VIEW COMPARISON:  07/16/2017 FINDINGS: Cardiac shadow is stable. The lungs are well aerated bilaterally with bibasilar infiltrative changes left greater than right. The overall appearance is stable from the prior exam. No new focal abnormality is seen. No bony abnormality is noted. IMPRESSION: Stable bibasilar infiltrates. Electronically Signed   By: Alcide Clever M.D.   On: 07/17/2017 07:17   Dg Chest Port 1 View  Result Date: 07/16/2017 CLINICAL DATA:  Respiratory failure, acute on chronic, current smoker. EXAM: PORTABLE CHEST 1 VIEW COMPARISON:  Portable chest x-ray of July 15, 2017 FINDINGS: The lungs are adequately inflated. Bibasilar densities are present and have increased on the right though are stable on the left. The right hemidiaphragm is now obscured. The cardiac silhouette is normal in size. The pulmonary vascularity is not clearly  engorged. The mediastinum is normal in width. The observed bony thorax is unremarkable. IMPRESSION: Worsening bibasilar atelectasis or pneumonia. Probable small left pleural effusion. No pulmonary edema. Electronically Signed   By: David  [place] M.D.   On: 07/16/2017 07:26   Anti-infectives (From admission, onward)   Start     Dose/Rate Route Frequency Ordered Stop   07/12/17 2200  vancomycin (VANCOCIN) IVPB 750 mg/150 ml premix  Status:  Discontinued     750 mg 150 mL/hr over 60  Minutes Intravenous Every 12 hours 07/12/17 1221 07/14/17 1003   07/12/17 2200  ceFEPIme (MAXIPIME) 1 g in dextrose 5 % 50 mL IVPB     1 g 100 mL/hr over 30 Minutes Intravenous Every 8 hours 07/12/17 1221     07/12/17 1300  ceFEPIme (MAXIPIME) 2 g in dextrose 5 % 50 mL IVPB     2 g 100 mL/hr over 30 Minutes Intravenous  Once 07/12/17 1144 07/12/17 1522   07/12/17 1300  vancomycin (VANCOCIN) 2,000 mg in sodium chloride 0.9 % 500 mL IVPB     2,000 mg 250 mL/hr over 120 Minutes Intravenous  Once 07/12/17 1144 07/12/17 1633   07/12/17 1000  doxycycline (VIBRA-TABS) tablet 100 mg  Status:  Discontinued     100 mg Oral Every 12 hours 07/12/17 0946 07/12/17 1229   07/11/17 1800  ceFAZolin (ANCEF) IVPB 2g/100 mL premix     2 g 200 mL/hr over 30 Minutes Intravenous Every 8 hours 07/11/17 1448 07/12/17 0340   07/11/17 0700  ceFAZolin (ANCEF) IVPB 2g/100 mL premix     2 g 200 mL/hr over 30 Minutes Intravenous 30 min pre-op 07/11/17 0700 07/11/17 0915       LINES/TUBES:   DISCUSSION:  ASSESSMENT / PLAN:  PULMONARY A: Acute on chronic respiratory failure due to ALS, possibly exacerbated by her recent procedure/sedation with left-sided atelectasis &  pneumonia.   She does have chronic left-sided weakness and may have chronically abnormal left hemidiaphragm excursion  P:   Continue BiPAP qhs , prn daytime, use HFNC daytime Aggressive chest PT, chest vest Incentive spirometry Have asked her husband to check with  DME on status of trilogy machine She may also benefit from a cough assist device   CARDIOVASCULAR A:  Hypertension P:   hydralazine,labetalol prn Restart PO amlodipin   RENAL / UROLOGICAL A:   Neurogenic bladder status post suprapubic catheter, Botox instillation 1/17 P:   Follow urine output, BMP, replace electrolytes as indicated Suprapubic catheter management per urology    GASTROINTESTINAL A:   SUP Constipation Protein calorie malnutrition,mild P:   Add PPI Swallow eval & advance Dulcolax prn suppository   INFECTIOUS A:   Left sided infiltrate, possible HCAP - PCT decreasing P:   Dc  empiric vancomycin  cefepime x 7-10 ds Follow blood cultures   NEUROLOGIC A:   ALS, [facility] Depression Anxiety P:   Wellbutrin and Effexor Ativan prn PT following   FAMILY  - Updates: 1/21 Extensive discussion with patient and her husband regarding goals of care.  Complicated situation :her mother -has severe multiple sclerosis with stage IV decub and is a nursing home and expected to die soon.  Patient does not want to pass before her mother.  She never wanted tracheostomy or feeding tube but did not expect that her condition  would deteriorate so soon. Palliative care to continue goals of care conversation  - Inter-disciplinary family meet or Palliative Care meeting due by:  07/19/16  P CCM will be available as needed  Erikka Follmer V. Vassie LollAlva MD  07/17/2017 11:19 AM

## 2017-07-17 NOTE — Evaluation (Signed)
Physical Therapy Evaluation Patient Details Name: Tara Whitehead MRN: [id number] DOB: [birth year] Today's Date: 07/17/2017   History of Present Illness  Pt admitted with acute resp failure and hx of ALS  Clinical Impression  Pt admitted as above and presenting with functional mobility limitations 2* generalized weakness and deconditioning.  Pt and spouse hope to progress to dc home to previous living arrangement.    Follow Up Recommendations Home health PT    Equipment Recommendations  None recommended by PT    Recommendations for Other Services OT consult     Precautions / Restrictions Precautions Precautions: Fall Restrictions Weight Bearing Restrictions: No      Mobility  Bed Mobility Overal bed mobility: Needs Assistance Bed Mobility: Supine to Sit     Supine to sit: Mod assist;+2 for safety/equipment     General bed mobility comments: Assist to bring trunk to upright and to weight shift to walk bottom fwd to sit EOB`  Transfers                 General transfer comment: NT - Pt states, I just dont think my legs can do it today.  Maxi sky used to tranfer bed to chair  Ambulation/Gait                Stairs            Wheelchair Mobility    Modified Rankin (Stroke Patients Only)       Balance Overall balance assessment: Needs assistance Sitting-balance support: Feet supported;No upper extremity supported Sitting balance-Leahy Scale: Fair Sitting balance - Comments: Pt balanced in bedside sitting with Sup x 8 min                                     Pertinent Vitals/Pain Pain Assessment: No/denies pain    Home Living Family/patient expects to be discharged to:: Private residence Living Arrangements: Spouse/significant other Available Help at Discharge: Family Type of Home: House Home Access: Ramped entrance     Home Layout: One level Home Equipment: Environmental consultant - 2 wheels;Bedside commode;Wheelchair - power;Other  (comment);Hospital bed(Hoyer lift)      Prior Function Level of Independence: Needs assistance   Gait / Transfers Assistance Needed: Bed to chair transfers only  ADL's / Homemaking Assistance Needed: Spouse        Hand Dominance        Extremity/Trunk Assessment   Upper Extremity Assessment Upper Extremity Assessment: Generalized weakness(L weaker than R)    Lower Extremity Assessment Lower Extremity Assessment: Generalized weakness       Communication   Communication: Expressive difficulties(Soft spoken, limits self to short answers)  Cognition Arousal/Alertness: Awake/alert Behavior During Therapy: WFL for tasks assessed/performed Overall Cognitive Status: Within Functional Limits for tasks assessed                                        General Comments      Exercises     Assessment/Plan    PT Assessment Patient needs continued PT services  PT Problem List Decreased strength;Decreased activity tolerance;Decreased balance;Decreased mobility;Decreased knowledge of use of DME;Obesity       PT Treatment Interventions DME instruction;Functional mobility training;Therapeutic activities;Therapeutic exercise;Balance training;Patient/family education    PT Goals (Current goals can be found in the Care Plan section)  Acute Rehab  PT Goals Patient Stated Goal: Return home with spouse as caregiver PT Goal Formulation: With patient Time For Goal Achievement: 07/31/17 Potential to Achieve Goals: Fair    Frequency Min 3X/week   Barriers to discharge        Co-evaluation               AM-PAC PT "6 Clicks" Daily Activity  Outcome Measure Difficulty turning over in bed (including adjusting bedclothes, sheets and blankets)?: Unable Difficulty moving from lying on back to sitting on the side of the bed? : Unable Difficulty sitting down on and standing up from a chair with arms (e.g., wheelchair, bedside commode, etc,.)?: Unable Help needed  moving to and from a bed to chair (including a wheelchair)?: Total Help needed walking in hospital room?: Total Help needed climbing 3-5 steps with a railing? : Total 6 Click Score: 6    End of Session Equipment Utilized During Treatment: Other (comment)(maxi sky) Activity Tolerance: Patient limited by fatigue Patient left: in chair;with call bell/phone within reach;with family/visitor present;with nursing/sitter in room Nurse Communication: Mobility status PT Visit Diagnosis: Muscle weakness (generalized) (M62.81);Difficulty in walking, not elsewhere classified (R26.2)    Time: 1008-1040 PT Time Calculation (min) (ACUTE ONLY): 32 min   Charges:   PT Evaluation $PT Eval Low Complexity: 1 Low PT Treatments $Therapeutic Activity: 8-22 mins   PT G Codes:        Pg [phone]   Damonte Frieson 07/17/2017, 1:45 PM

## 2017-07-17 NOTE — Progress Notes (Signed)
Unable to do CPT at this time. She is up in motorized chair.

## 2017-07-17 NOTE — Progress Notes (Signed)
Unable to do cpt pt up in chair.

## 2017-07-17 NOTE — Progress Notes (Signed)
PROGRESS NOTE  Tara JabsSharon P Pebley ZOX:[id number] DOB: 08-17-1960 DOA: 07/11/2017 PCP: Philemon KingdomProchnau, Caroline, MD  HPI/Recap of past 24 hours:  Tara Whitehead is a 57 y.o. year old female with medical history significant for ALS, anxiety, depression, HTN, arthritis, s/p knee replacements who presented on 07/11/2017 with SOB and HTN at 1 day postop suprapubic catheter placement and intravesical botox injection requiring placement on BiPAP and prompting transfer to ICU.  She was started empirically on broad-spectrum antibiotics, given empiric Lasix, received chest x-ray which showed possible pleural effusion with elevated left hemidiaphragm.    This morning, tolerating BiPAP during my initial exam in the morning.  Came back later this evening to visit patient was sitting at bedside with her husband tolerating her dinner no complaints.  States a significant improvement in her breathing.  At the time of my exam patient is on nasal cannula  Assessment/Plan: Active Problems:   Neurogenic bladder disorder   Pressure injury of skin   SOB (shortness of breath)   Acute on chronic respiratory failure with hypoxia (HCC)   HCAP (healthcare-associated pneumonia)   ALS (amyotrophic lateral sclerosis) (HCC)   Goals of care, counseling/discussion   Palliative care encounter  Acute on chronic respiratory failure, multifactorial etiology suspect primary driver progression of ALS; however cannot rule out HCAP in setting of acute leukocytosis and bibasilar infiltrates chest x-ray.  This likely current symptoms exacerbated from recent procedure.  On my exam currently tolerating high flow nasal cannula but has typically required returning to BiPAP.  Some initial concern for aspiration however has modified barium swallow and now on appropriate diet.  Continue supportive care with aggressive chest PT, PCCM following, empiric cefepime we will continue to monitor.  PE felt to be less likely but will continue to keep an  differential  ALS, guarded prognosis.  Patient continued to have goals of care discussion with health care team.  Her primary concern is not passing for her mother who has end-stage multiple sclerosis.  Please see patient on interested in trach/PEG or tube feeds however to expect her prognosis to worsen this quickly and would prefer not to pass with before her mother.  Neurogenic bladder status post suprapubic catheter, Botox instillation on 07/11/17. Management per urology, continue to follow urine output and monitor electrolytes on BMP.  Sinus tachycardia, suspect related to dehydration.  Likely worse in the setting of n.p.o. status while awaiting swallow studies.  Patient currently with a diet.  Will give time IV fluids.  If persists will need to consider other etiologies?  PE (would not expect improvement in respiratory status with BiPAP and antibiotics if this is the cause of her respiratory failure)   Hypertension.  Resume home amlodipine now that modified barium swallow complete.  Depression/anxiety.  Resume home Wellbutrin and Effexor passed modified barium swallow.  Code Status: Full code  Family Communication: Husband updated at bedside  Disposition Plan: Monitor auditory status, once able to tolerate being off BiPAP and stabilizing on nasal cannula, eventually de-escalate IV antibiotics to oral regimen  Consultants: Palliative care, PCCM  Procedures:  TTE on 07/12/17: EF 60-65%.  No regional wall motion normalities.  Grade 1 diastolic dysfunction.  Antimicrobials:  Cefepime 1/18>>>  Doxycycline 1/18  Vancomycin 1/18-1/20   Cultures:  07/12/17: Blood culture x2- growth to date   DVT prophylaxis: SCDs   Objective: Vitals:   07/17/17 1900 07/17/17 2011 07/17/17 2100 07/17/17 2109  BP: (!) 149/85  (!) 152/69   Pulse:  Resp: (!) 27  (!) 22   Temp:  98.5 F (36.9 C)    TempSrc:  Axillary    SpO2: 100%  92% 92%  Weight: 115.8 kg (255 lb 4.7 oz)     Height:         Intake/Output Summary (Last 24 hours) at 07/17/2017 2118 Last data filed at 07/17/2017 1610 Gross per 24 hour  Intake 210 ml  Output 400 ml  Net -190 ml   Filed Weights   07/11/17 0720 07/17/17 1900  Weight: 117.9 kg (260 lb) 115.8 kg (255 lb 4.7 oz)    Exam:  General: Sitting at bedside chair, no apparent distress Eyes: EOMI ENT: Dry oral Mucosa Neck: normal, no appreciable JVD Cardiovascular: Tachycardic, no appreciable murmurs, rubs or gallops  Respiratory: On nasal cannula, and a posterior distress, difficult to appreciate lung sounds given body habitus  Skin: No Rash Musculoskeletal:No clubbing / cyanosis.  Neurologic: Mental status AAOx3, speech normal, Psychiatric:Appropriate affect, and mood  Data Reviewed: CBC: Recent Labs  Lab 07/13/17 0316 07/14/17 0236 07/15/17 0351 07/16/17 0324 07/17/17 0330  WBC 38.9* 23.7* 16.9* 13.5* 12.2*  NEUTROABS  --  21.2* 14.4* 11.2* 9.3*  HGB 16.3* 15.2* 14.6 15.8* 16.6*  HCT 47.6* 47.1* 45.8 48.1* 52.6*  MCV 87.0 88.0 90.0 89.6 91.3  PLT 255 225 221 211 163   Basic Metabolic Panel: Recent Labs  Lab 07/13/17 0316 07/14/17 0236 07/15/17 0351 07/16/17 0324 07/17/17 0330  NA 136 141 141 141 144  K 3.4* 3.9 4.0 3.9 3.9  CL 101 102 100* 102 103  CO2 27 31 32 30 31  GLUCOSE 140* 102* 99 92 92  BUN 17 17 16 16 16   CREATININE <0.30* 0.31*  0.32* <0.30* <0.30* <0.30*  CALCIUM 8.7* 9.0 8.9 9.0 9.1  MG 1.8 2.0 1.8 1.9 2.0  PHOS  --  2.4* 2.7 2.9 3.0   GFR: CrCl cannot be calculated (This lab value cannot be used to calculate CrCl because it is not a number: <0.30). Liver Function Tests: Recent Labs  Lab 07/12/17 1158 07/15/17 0351 07/17/17 0330  AST 19 13* 18  ALT 22 13* 13*  ALKPHOS 95 74 69  BILITOT 0.5 1.0 0.5  PROT 7.7 6.7 7.0  ALBUMIN 3.8 3.0* 3.0*   No results for input(s): LIPASE, AMYLASE in the last 168 hours. No results for input(s): AMMONIA in the last 168 hours. Coagulation Profile: Recent  Labs  Lab 07/12/17 1158  INR 0.93   Cardiac Enzymes: Recent Labs  Lab 07/12/17 1000 07/12/17 1527 07/12/17 2154  TROPONINI <0.03 <0.03 <0.03   BNP (last 3 results) No results for input(s): PROBNP in the last 8760 hours. HbA1C: No results for input(s): HGBA1C in the last 72 hours. CBG: Recent Labs  Lab 07/14/17 0824  GLUCAP 99   Lipid Profile: No results for input(s): CHOL, HDL, LDLCALC, TRIG, CHOLHDL, LDLDIRECT in the last 72 hours. Thyroid Function Tests: No results for input(s): TSH, T4TOTAL, FREET4, T3FREE, THYROIDAB in the last 72 hours. Anemia Panel: No results for input(s): VITAMINB12, FOLATE, FERRITIN, TIBC, IRON, RETICCTPCT in the last 72 hours. Urine analysis: No results found for: COLORURINE, APPEARANCEUR, LABSPEC, PHURINE, GLUCOSEU, HGBUR, BILIRUBINUR, KETONESUR, PROTEINUR, UROBILINOGEN, NITRITE, LEUKOCYTESUR Sepsis Labs: @LABRCNTIP (procalcitonin:4,lacticidven:4)  ) Recent Results (from the past 240 hour(s))  Culture, blood (routine x 2)     Status: None   Collection Time: 07/12/17 11:55 AM  Result Value Ref Range Status   Specimen Description BLOOD RIGHT ANTECUBITAL  Final  Special Requests   Final    BOTTLES DRAWN AEROBIC AND ANAEROBIC Blood Culture adequate volume   Culture   Final    NO GROWTH 5 DAYS Performed at [facility], [address] [place], [place] [address]    Report Status 07/17/2017 FINAL  Final  Culture, blood (routine x 2)     Status: None   Collection Time: 07/12/17 11:55 AM  Result Value Ref Range Status   Specimen Description BLOOD BLOOD LEFT HAND  Final   Special Requests IN PEDIATRIC BOTTLE Blood Culture adequate volume  Final   Culture   Final    NO GROWTH 5 DAYS Performed at [facility], [address] [place], [place] [address]    Report Status 07/17/2017 FINAL  Final  MRSA PCR Screening     Status: None   Collection Time: 07/12/17 12:24 PM  Result Value Ref Range Status   MRSA by PCR NEGATIVE NEGATIVE  Final    Comment:        The GeneXpert MRSA Assay (FDA approved for NASAL specimens only), is one component of a comprehensive MRSA colonization surveillance program. It is not intended to diagnose MRSA infection nor to guide or monitor treatment for MRSA infections.       Studies: Dg Chest Port 1 View  Result Date: 07/17/2017 CLINICAL DATA:  Hypoxia EXAM: PORTABLE CHEST 1 VIEW COMPARISON:  07/16/2017 FINDINGS: Cardiac shadow is stable. The lungs are well aerated bilaterally with bibasilar infiltrative changes left greater than right. The overall appearance is stable from the prior exam. No new focal abnormality is seen. No bony abnormality is noted. IMPRESSION: Stable bibasilar infiltrates. Electronically Signed   By: Alcide Clever M.D.   On: 07/17/2017 07:17   Dg Swallowing Func-speech Pathology  Result Date: 07/17/2017 Objective Swallowing Evaluation: Type of Study: MBS-Modified Barium Swallow Study  Patient Details Name: CLARINDA OBI MRN: [id number] Date of Birth: Mar 02, 1961 Today's Date: 07/17/2017 Time: SLP Start Time (ACUTE ONLY): 1425 -SLP Stop Time (ACUTE ONLY): 1500 SLP Time Calculation (min) (ACUTE ONLY): 35 min Past Medical History: Past Medical History: Diagnosis Date . ALS (amyotrophic lateral sclerosis) (HCC) 11/23/2016 . Anxiety  . Arthritis  . Complication of anesthesia   unable to do spinal at one of surgeries  . Depression  . Dyspnea   due to ALS  . Hypertension  . Neuromuscular disorder (HCC)   ALS  Past Surgical History: Past Surgical History: Procedure Laterality Date . APPENDECTOMY   . BILATERAL CARPAL TUNNEL RELEASE   . BOTOX INJECTION N/A 07/11/2017  Procedure: BOTOX INJECTION;  Surgeon: Bjorn Pippin, MD;  Location: [facility];  Service: Urology;  Laterality: N/A; . INSERTION OF SUPRAPUBIC CATHETER N/A 07/11/2017  Procedure: INSERTION OF SUPRAPUBIC CATHETER;  Surgeon: Bjorn Pippin, MD;  Location: [facility];  Service: Urology;  Laterality: N/A; . JOINT REPLACEMENT    3 knee  replacements HPI: 57 yo female with ALS admitted to [facility] with respiratory failure.   Pt required Bipap and some discussion re: possible trach/PEG took place.  Today pt is on high flow oxygen and has been off Bipap.  Swallow eval ordered and pt seen yesterday for clinical evaluation.  Follow-up indicated to determine readiness for instrumental swallow evaluation.    Subjective: pt awake in chair Assessment / Plan / Recommendation CHL IP CLINICAL IMPRESSIONS 07/17/2017 Clinical Impression  Patient presents with mild oropharyngeal dysphagia with decreased oral bolus cohesion, premature spillage into pharynx and mild tongue base residuals.  Often pt  would initiate dry swallow to facilitate oropharyngeal clearance.  Decreased laryngeal elevation/closure results in laryngeal penetration and trace aspiration of thin liquids and penetration of nectar.  Trace secretions noted to mix with barium spilling into larynx/trachea.  Chin tuck posture decreased penetration but did not fully prevent it consistently.  In addition increased viscocity of any bolus resulted in worsening backflow through UES without pt adequate sensation.  Again dry swallow helpful to decrease.  Pharyngeal swallow with solids/puree was strong without penetration or aspiration nor significant residuals.  Pt did not sense trace aspiration of thin - causing SLP to suspect low grade chronic aspiration of secretions and water intake.  Although pt only tracely aspirated, she has increased risk of pulmonary compromise with any aspiration.  Unfortunately pt could not consistently clear penetrates fully with cued cough/throat clear.  Due to pt's weakness from her ALS, she is aspirating secretions and given level of pulmonary weakness, minimal aspiration concerning for pulmonary clearance.  Educated pt/family to findings/recommendations using teach back.  Informed pt and spouse that feeding tube would not prevent her from aspiration of secretions.    If pt desires  diet and MDs are agreeable, would recommend dys3/thin - using chin tuck, follow solids with liquids, multiple swallows and medicine with puree - whole.  SLP to follow up for RMST to strengthen cough/airway protection and subsequent submental musculature to aid swallow.  Pt continued to indicate her initial reaction after MBS was that she did not want a feeding tube.   SLP Visit Diagnosis Dysphagia, oropharyngeal phase (R13.12);Dysphagia, pharyngoesophageal phase (R13.14) Attention and concentration deficit following -- Frontal lobe and executive function deficit following -- Impact on safety and function Mild aspiration risk   CHL IP TREATMENT RECOMMENDATION 07/17/2017 Treatment Recommendations Therapy as outlined in treatment plan below   Prognosis 07/17/2017 Prognosis for Safe Diet Advancement Fair Barriers to Reach Goals Severity of deficits;Other (Comment) Barriers/Prognosis Comment -- CHL IP DIET RECOMMENDATION 07/17/2017 SLP Diet Recommendations (No Data) Liquid Administration via -- Medication Administration Whole meds with puree Compensations Slow rate;Small sips/bites;Chin tuck;Follow solids with liquid Postural Changes Remain semi-upright after after feeds/meals (Comment);Seated upright at 90 degrees   CHL IP OTHER RECOMMENDATIONS 07/17/2017 Recommended Consults -- Oral Care Recommendations Oral care QID Other Recommendations --   CHL IP FOLLOW UP RECOMMENDATIONS 07/17/2017 Follow up Recommendations (No Data)   CHL IP FREQUENCY AND DURATION 07/17/2017 Speech Therapy Frequency (ACUTE ONLY) min 2x/week Treatment Duration 1 week      CHL IP ORAL PHASE 07/17/2017 Oral Phase Impaired Oral - Pudding Teaspoon -- Oral - Pudding Cup -- Oral - Honey Teaspoon -- Oral - Honey Cup -- Oral - Nectar Teaspoon -- Oral - Nectar Cup Reduced posterior propulsion;Premature spillage;Lingual/palatal residue;Decreased bolus cohesion Oral - Nectar Straw Premature spillage;Piecemeal swallowing;Lingual/palatal residue;Decreased bolus  cohesion Oral - Thin Teaspoon Premature spillage;Piecemeal swallowing;Lingual/palatal residue;Decreased bolus cohesion Oral - Thin Cup Premature spillage;Piecemeal swallowing;Lingual/palatal residue;Decreased bolus cohesion Oral - Thin Straw Piecemeal swallowing;Lingual/palatal residue;Decreased bolus cohesion Oral - Puree Piecemeal swallowing;Premature spillage;Lingual/palatal residue Oral - Mech Soft Decreased bolus cohesion;Premature spillage;Piecemeal swallowing;Lingual/palatal residue Oral - Regular -- Oral - Multi-Consistency -- Oral - Pill Lingual/palatal residue;Piecemeal swallowing;Premature spillage Oral Phase - Comment --  CHL IP PHARYNGEAL PHASE 07/17/2017 Pharyngeal Phase Impaired Pharyngeal- Pudding Teaspoon -- Pharyngeal -- Pharyngeal- Pudding Cup -- Pharyngeal -- Pharyngeal- Honey Teaspoon -- Pharyngeal -- Pharyngeal- Honey Cup -- Pharyngeal -- Pharyngeal- Nectar Teaspoon -- Pharyngeal -- Pharyngeal- Nectar Cup Reduced airway/laryngeal closure;Penetration/Aspiration during swallow;Pharyngeal residue - pyriform;Pharyngeal residue - cp segment Pharyngeal  Material enters airway, remains ABOVE vocal cords and not ejected out Pharyngeal- Nectar Straw Delayed swallow initiation-pyriform sinuses;Reduced laryngeal elevation;Reduced airway/laryngeal closure;Penetration/Aspiration during swallow;Penetration/Aspiration before swallow;Pharyngeal residue - pyriform;Pharyngeal residue - cp segment Pharyngeal Material enters airway, remains ABOVE vocal cords and not ejected out Pharyngeal- Thin Teaspoon Reduced laryngeal elevation;Reduced airway/laryngeal closure;Pharyngeal residue - cp segment;Pharyngeal residue - pyriform;Penetration/Aspiration during swallow Pharyngeal Material enters airway, remains ABOVE vocal cords and not ejected out Pharyngeal- Thin Cup Reduced laryngeal elevation;Reduced airway/laryngeal closure;Pharyngeal residue - pyriform;Pharyngeal residue - cp segment;Penetration/Aspiration during  swallow Pharyngeal Material enters airway, passes BELOW cords without attempt by patient to eject out (silent aspiration) Pharyngeal- Thin Straw Reduced laryngeal elevation;Reduced airway/laryngeal closure;Reduced tongue base retraction;Pharyngeal residue - pyriform;Pharyngeal residue - cp segment Pharyngeal -- Pharyngeal- Puree Reduced laryngeal elevation;Reduced airway/laryngeal closure;Pharyngeal residue - pyriform;Pharyngeal residue - cp segment Pharyngeal -- Pharyngeal- Mechanical Soft Reduced laryngeal elevation;Reduced airway/laryngeal closure;Pharyngeal residue - pyriform;Pharyngeal residue - cp segment Pharyngeal -- Pharyngeal- Regular -- Pharyngeal -- Pharyngeal- Multi-consistency -- Pharyngeal -- Pharyngeal- Pill Reduced laryngeal elevation;Reduced airway/laryngeal closure;Pharyngeal residue - cp segment;Pharyngeal residue - pyriform Pharyngeal -- Pharyngeal Comment pt with backflow of barium from UES without awareness, dry swallows helpful to decrease, chin tuck posture did not fully prevent penetration, cued cough/throat clear not consistently effective to clear TRACE penetrates and TRACE aspiration   CHL IP CERVICAL ESOPHAGEAL PHASE 07/17/2017 Cervical Esophageal Phase Impaired Pudding Teaspoon -- Pudding Cup -- Honey Teaspoon -- Honey Cup -- Nectar Teaspoon -- Nectar Cup Reduced cricopharyngeal relaxation;Esophageal backflow into the pharynx Nectar Straw Reduced cricopharyngeal relaxation;Esophageal backflow into the pharynx Thin Teaspoon Reduced cricopharyngeal relaxation;Esophageal backflow into the pharynx Thin Cup Reduced cricopharyngeal relaxation;Esophageal backflow into the pharynx Thin Straw Esophageal backflow into the pharynx;Reduced cricopharyngeal relaxation Puree Reduced cricopharyngeal relaxation;Esophageal backflow into the pharynx Mechanical Soft Esophageal backflow into the pharynx;Reduced cricopharyngeal relaxation Regular -- Multi-consistency -- Pill Reduced cricopharyngeal  relaxation;Esophageal backflow into the pharynx Cervical Esophageal Comment could not sweep further distally into esophagus secondary to pt being in her motorized wheelchair No flowsheet data found. Chales Abrahams 07/17/2017, 4:48 PM    Donavan Burnet, MS [facility] SLP [phone]            Scheduled Meds: . enoxaparin (LOVENOX) injection  60 mg Subcutaneous Q24H  . mouth rinse  15 mL Mouth Rinse BID  . mouth rinse  15 mL Mouth Rinse q12n4p  . nicotine  14 mg Transdermal Daily    Continuous Infusions: . sodium chloride 100 mL/hr at 07/17/17 2058  . ceFEPime (MAXIPIME) IV 1 g (07/17/17 2059)     LOS: 5 days     Laverna Peace, MD [facility] Pager [phone]  If 7PM-7AM, please contact night-coverage [email or web address] Password [email or web address] 07/17/2017, 9:18 PM

## 2017-07-17 NOTE — Progress Notes (Signed)
CSW consult- "pt needs assistance moving forward"  CSW met with the patient at bedside, explain role and reason for visit. Patient reports question about her current insurance-"[employer] medicare and home health options."  Patient and spouse would like to discuss there H.H options. CSW to notify RNCM.   [name], Latanya Presser, MSW Clinical Social Worker  [phone] 07/17/2017  10:53 AM

## 2017-07-17 NOTE — Progress Notes (Addendum)
Daily Progress Note   Patient Name: Tara Whitehead       Date: 07/17/2017 DOB: 1961/04/28  Age: 57 y.o. MRN#: [id number] Attending Physician: Desiree Hane, MD Primary Care Physician: Ernestene Kiel, MD Admit Date: 07/11/2017  Reason for Consultation/Follow-up: Establishing goals of care  Subjective: Gaynelle is more alert and after taken on BiPAP and onto nasal cannula able to have conversation with me.   Length of Stay: 5  Current Medications: Scheduled Meds:  . enoxaparin (LOVENOX) injection  60 mg Subcutaneous Q24H  . mouth rinse  15 mL Mouth Rinse BID  . mouth rinse  15 mL Mouth Rinse q12n4p  . nicotine  14 mg Transdermal Daily  . sodium chloride HYPERTONIC  4 mL Nebulization BID    Continuous Infusions: . ceFEPime (MAXIPIME) IV 1 g (07/17/17 0625)    PRN Meds: albuterol, belladonna-opium, clotrimazole, hydrALAZINE, labetalol, LORazepam, ondansetron, triamcinolone cream  Physical Exam         Constitutional: She appears well-developed. She has a sickly appearance. She appears ill.  HENT:  Head: Normocephalic and atraumatic.  Cardiovascular: Tachycardia present.  Pulmonary/Chest: She has decreased breath sounds.  Abdominal: Soft. Normal appearance.  Neurological: She appears alert, oriented x 3.  Nursing note and vitals reviewed.   Vital Signs: BP (!) 164/79   Pulse (!) 116   Temp 97.6 F (36.4 C) (Axillary)   Resp (!) 28   Ht '5\' 11"'$  (1.803 m)   Wt 117.9 kg (260 lb)   SpO2 100%   BMI 36.26 kg/m  SpO2: SpO2: 100 % O2 Device: O2 Device: Bi-PAP O2 Flow Rate: O2 Flow Rate (L/min): 15 L/min  Intake/output summary:   Intake/Output Summary (Last 24 hours) at 07/17/2017 1005 Last data filed at 07/17/2017 3291 Gross per 24 hour  Intake 360 ml  Output 1100  ml  Net -740 ml   LBM: Last BM Date: 07/09/17 Baseline Weight: Weight: 117.9 kg (260 lb) Most recent weight: Weight: 117.9 kg (260 lb)       Palliative Assessment/Data: 20%      Patient Active Problem List   Diagnosis Date Noted  . HCAP (healthcare-associated pneumonia)   . Pressure injury of skin 07/12/2017  . SOB (shortness of breath)   . Acute on chronic respiratory failure with hypoxia ([place])   . Neurogenic  bladder disorder 07/11/2017    Palliative Care Assessment & Plan   HPI: 57 y.o. female  with past medical history of ALS, anxiety, depression, HTN, arthritis, s/p knee replacements admitted on 07/11/2017 with SOB and HTN at 1 day postop suprapubic catheter placement and intravesical botox injection. She has continued to struggle with dependence on BiPAP and breaks with HFNC 10L, weak cough, unable to have intake. Palliative care requested to assist with [place] conversations regarding desires for trach/PEG if she continues to decline.    Assessment: I met today with Ivin Booty and Tim at bedside. Lajuanda was placed on nasal cannula by RT and completed neb treatment and chest PT. We then had a conversation where I introduced palliative care. We discussed her concerns mainly with her mother and concern with anxiety management (although management is currently adequate). We further discussed her GOC in relation to trach/PEG and if this is something she desires or not. I did reaffirm that even if she desires to proceed with trach/PEG that these can be discontinued with a shift to comfort care at any time of her choosing. I did explain that sometimes taking away is much more difficult than not adding to begin with. We also further discussed that without trach/PEG we would continue to try and optimize her health to the best of our ability. She would like time to consider her options. I did explain this is just planning so that if she declines we know what she would want. They understand.    They also express concern over assistance at home. I explained that we will work to get them resources and support but this depends on outcomes and in what health condition she returns home in. [name] is self employed but has not really worked much the past 2-3 months as he has been Haematologist for [employer]. He seems to expect to keep caring for her at home but they both know that he will need help. They do express concerns over financial resources and have been blessed with a neighbor that recently passed with ALS and his wife has given them much equipment that has made their lives much better and much easier.   Recommendations/Plan:  Anxiety: Ativan 0.5-1 mg IV every 4 hours prn.   SOB: Per PCCM. BiPAP, nebs, chest PT.  SLP following for window to further assess swallow safety.    Goals of Care and Additional Recommendations:  Limitations on Scope of Treatment: Full Scope Treatment  Code Status:  FULL  Prognosis:   Overall prognosis remains poor with progressing ALS. Short term prognosis dependent on aggressiveness of care.   Discharge Planning:  Hopeful to return home   Thank you for allowing the Palliative Medicine Team to assist in the care of this patient.   Total Time 60 min Prolonged Time Billed  no       Greater than 50%  of this time was spent counseling and coordinating care related to the above assessment and plan.  Vinie Sill, NP Palliative Medicine Team Pager # [phone] (M-F 8a-5p) Team Phone # [phone] (Nights/Weekends)

## 2017-07-17 NOTE — Progress Notes (Signed)
  Speech Language Pathology Treatment: Dysphagia  Patient Details Name: Tara Whitehead MRN: [id number] DOB: Oct 30, 1960 Today's Date: 07/17/2017 Time: 1150-1200 SLP Time Calculation (min) (ACUTE ONLY): 10 min  Assessment / Plan / Recommendation Clinical Impression  Pt today sitting upright in her motorized wheelchair - reports using Bipap last night from 10 pm to 9 am.  Weak cough continues at baseline today but again more pronounced with intake.  Pt seen with tsp amounts of water only - intermittent multiple swallows suspected to be due piecemealing.  Spouse/pt agreeable to proceed with MBS today to instrumentally determine oropharyngeal function.   Will attempt to test pt in her wheelchair for maximal functional outcome and pt comfort.  RN, pt/spouse and Xray informed of plan.     HPI HPI: 57 yo female with ALS [facility].   Pt required Bipap and some discussion re: possible trach/PEG took place.  Today pt is on high flow oxygen and has been off Bipap.  Swallow eval ordered and pt seen yesterday for clinical evaluation.  Follow-up indicated to determine readiness for instrumental swallow evaluation.         SLP Plan  MBS       Recommendations  Medication Administration: Via alternative means                Oral Care Recommendations: Oral care BID Follow up Recommendations: Other (comment)(tbd) SLP Visit Diagnosis: Dysphagia, oropharyngeal phase (R13.12) Plan: MBS       GO                Tara AbrahamsKimball, Ronn Smolinsky Ann 07/17/2017, 12:09 PM  Donavan Burnetamara Trinnity Breunig, MS [facility] SLP [phone]

## 2017-07-17 NOTE — Progress Notes (Signed)
MBS completed, full report to follow.  Patient presents with mild oropharyngeal dysphagia with decreased laryngeal elevation/closure resulting in laryngeal penetration and trace aspiration of thin liquids and penetration of nectar.  Trace secretions noted to mix with barium spilling into larynx/trachea.  Chin tuck posture decreased penetration but did not fully prevent it consistently.  In addition increased viscocity resulted in worsening backflow of liquids through UES.  Unfortunately pt could not consistently clear penetrates fully with cued cough/throat clear. No aspiration/penetration with pudding, cracker or tablet with pudding and minimal residual present.  Due to pt's weakness from her ALS, she is aspirating secretions and given level of pulmonary weakness, minimal aspiration concerning for pulmonary clearance.  Educated pt/family to findings/recommendations using teach back.  Informed pt and spouse that feeding tube would not prevent her from aspiration of secretions.    If pt desires po with mitigation strategies, would recommend:  Dys3/Thin  Tuck chin with all bites/sips- use straws Multiple swallows with each liquid bolus Medicine with puree - whole - Follow with liquids Frequent small meals   Tara Burnetamara Kariya Lavergne, MS [facility] SLP [phone]

## 2017-07-17 NOTE — Progress Notes (Signed)
Modified Barium Swallow Progress Note  Patient Details  Name: Tara Whitehead MRN: [id number] Date of Birth: 24-Oct-1960  Today's Date: 07/17/2017  Modified Barium Swallow completed.  Full report located under Chart Review in the Imaging Section.  Brief recommendations include the following:  Clinical Impression   Patient presents with mild oropharyngeal dysphagia with decreased oral bolus cohesion, premature spillage into pharynx and mild tongue base residuals.  Often pt would initiate dry swallow to facilitate oropharyngeal clearance.  Decreased laryngeal elevation/closure results in laryngeal penetration and trace aspiration of thin liquids and penetration of nectar.  Trace secretions noted to mix with barium spilling into larynx/trachea.  Chin tuck posture decreased penetration but did not fully prevent it consistently.  In addition increased viscocity of any bolus resulted in worsening backflow through UES without pt adequate sensation.  Again dry swallow helpful to decrease.  Pharyngeal swallow with solids/puree was strong without penetration or aspiration nor significant residuals.  Pt did not sense trace aspiration of thin - causing SLP to suspect low grade chronic aspiration of secretions and water intake.  Although pt only tracely aspirated, she has increased risk of pulmonary compromise with any aspiration.  Unfortunately pt could not consistently clear penetrates fully with cued cough/throat clear.  Due to pt's weakness from her ALS, she is aspirating secretions and given level of pulmonary weakness, minimal aspiration concerning for pulmonary clearance.  Educated pt/family to findings/recommendations using teach back.  Informed pt and spouse that feeding tube would not prevent her from aspiration of secretions.    If pt desires diet and MDs are agreeable, would recommend dys3/thin - using chin tuck, follow solids with liquids, multiple swallows and medicine with puree - whole.  SLP to follow  up for RMST to strengthen cough/airway protection and subsequent submental musculature to aid swallow.  Pt continued to indicate her initial reaction after MBS was that she did not want a feeding tube.     Swallow Evaluation Recommendations       SLP Diet Recommendations: (see report)       Medication Administration: Whole meds with puree   Supervision: Full supervision/cueing for compensatory strategies;Staff to assist with self feeding   Compensations: Slow rate;Small sips/bites;Chin tuck;Follow solids with liquid   Postural Changes: Remain semi-upright after after feeds/meals (Comment);Seated upright at 90 degrees   Oral Care Recommendations: Oral care QID       Tara Burnetamara Blessin Kanno, MS [facility] SLP [phone]  Chales AbrahamsKimball, Tara Whitehead Ann 07/17/2017,4:49 PM

## 2017-07-18 ENCOUNTER — Inpatient Hospital Stay (HOSPITAL_COMMUNITY): Payer: PPO

## 2017-07-18 LAB — CREATININE, SERUM: Creatinine, Ser: <0.3 mg/dL — ABNORMAL LOW (ref 0.44–1.00)

## 2017-07-18 MED ORDER — ACETAMINOPHEN 650 MG RE SUPP: 650.0000 mg | Freq: Four times a day (QID) | RECTAL | Status: DC | PRN

## 2017-07-18 MED ORDER — ACETAMINOPHEN 325 MG PO TABS
650.0000 mg | ORAL_TABLET | Freq: Four times a day (QID) | ORAL | Status: DC | PRN
  Administered 2017-07-18 – 2017-07-19 (×3): 650 mg via ORAL
  Filled 2017-07-18 (×3): qty 2

## 2017-07-18 NOTE — Progress Notes (Signed)
Daily Progress Note   Patient Name: Tara Whitehead       Date: 07/18/2017 DOB: 1961-04-18  Age: 57 y.o. MRN#: [id number] Attending Physician: Laverna Peace, MD Primary Care Physician: Philemon Kingdom, MD Admit Date: 07/11/2017  Reason for Consultation/Follow-up: Establishing goals of care  Subjective: Sandhya is alert, oriented and on NRB this morning.   Length of Stay: 6  Current Medications: Scheduled Meds:  . amLODipine  5 mg Oral Daily  . buPROPion  150 mg Oral Daily  . enoxaparin (LOVENOX) injection  60 mg Subcutaneous Q24H  . mouth rinse  15 mL Mouth Rinse BID  . mouth rinse  15 mL Mouth Rinse q12n4p  . nicotine  14 mg Transdermal Daily  . venlafaxine XR  225 mg Oral Q breakfast    Continuous Infusions: . sodium chloride 100 mL/hr at 07/18/17 0500  . ceFEPime (MAXIPIME) IV Stopped (07/18/17 0700)    PRN Meds: acetaminophen **OR** acetaminophen, albuterol, belladonna-opium, clotrimazole, hydrALAZINE, labetalol, LORazepam, ondansetron, triamcinolone cream  Physical Exam         Constitutional: She appears well-developed. She has a sickly appearance. She appears ill.  HENT:  Head: Normocephalic and atraumatic.  Cardiovascular: Tachycardia present.  Pulmonary/Chest: She has decreased breath sounds.  Abdominal: Soft. Normal appearance.  Neurological: She appears alert, oriented x 3.  Nursing note and vitals reviewed.   Vital Signs: BP (!) 148/122   Pulse (!) 106   Temp (!) 97.5 F (36.4 C) (Oral)   Resp (!) 23   Ht 5\' 11"  (1.803 m)   Wt 115.8 kg (255 lb 4.7 oz)   SpO2 95%   BMI 35.61 kg/m  SpO2: SpO2: 95 % O2 Device: O2 Device: Nasal Cannula O2 Flow Rate: O2 Flow Rate (L/min): 15 L/min  Intake/output summary:   Intake/Output Summary (Last 24 hours)  at 07/18/2017 0935 Last data filed at 07/18/2017 0600 Gross per 24 hour  Intake 1293.33 ml  Output 525 ml  Net 768.33 ml   LBM: Last BM Date: 07/09/17 Baseline Weight: Weight: 117.9 kg (260 lb) Most recent weight: Weight: 115.8 kg (255 lb 4.7 oz)       Palliative Assessment/Data: 20%    Flowsheet Rows     Most Recent Value  Intake Tab  Referral Department  Hospitalist  Unit at Time of  Referral  ICU  Palliative Care Primary Diagnosis  Other (Comment) [ALS]  Date Notified  07/15/17  Palliative Care Type  New Palliative care  Reason for referral  Clarify Goals of Care  Date of Admission  07/11/17  Date first seen by Palliative Care  07/16/17  # of days Palliative referral response time  1 Day(s)  # of days IP prior to Palliative referral  4  Clinical Assessment  Psychosocial & Spiritual Assessment  Palliative Care Outcomes      Patient Active Problem List   Diagnosis Date Noted  . ALS (amyotrophic lateral sclerosis) ([facility])   . Goals of care, counseling/discussion   . Palliative care encounter   . HCAP (healthcare-associated pneumonia)   . Pressure injury of skin 07/12/2017  . SOB (shortness of breath)   . Acute on chronic respiratory failure with hypoxia ([facility])   . Neurogenic bladder disorder 07/11/2017    Palliative Care Assessment & Plan   HPI: 57 y.o. female  with past medical history of ALS, anxiety, depression, HTN, arthritis, s/p knee replacements admitted on 07/11/2017 with SOB and HTN at 1 day postop suprapubic catheter placement and intravesical botox injection. She has continued to struggle with dependence on BiPAP and breaks with HFNC 10L, weak cough, unable to have intake. Palliative care requested to assist with [place] conversations regarding desires for trach/PEG if she continues to decline.    Assessment: Long conversation today with Jasmine DecemberSharon and her aunt Jamesetta Sohyllis at bedside. Jasmine DecemberSharon shares with me that she has decided not to pursue trach/PEG if it comes to  this. Hopeful to continue current therapies (abx, BiPAP, chest PT, cough assist, nebs) to extend her life and get her back home. We clarified her wishes and she has chosen DNR. She says she has spoken with husband, Jorja Loaim, and friend, Maureen RalphsVivian, about this as well.   She talks about potentially getting home on Monday and having services to help them at home. I did explain that given her current state and changes in health status requiring BiPAP and high oxygen now she could potentially have the assistance of hospice at home. She understands and is open to this but wishes to discuss with her family. I will call her friend Maureen RalphsVivian to help talk with husband Jorja Loaim about the option of hospice. Emotional support provided.   Recommendations/Plan:  Anxiety: Ativan 0.5-1 mg IV every 4 hours prn.   SOB: Per PCCM. BiPAP, nebs, chest PT.   SLP following for window to further assess swallow safety.   Pain/fever/headache: Acetaminophen 650 mg po or suppository every 6 hours prn.    Goals of Care and Additional Recommendations:  Limitations on Scope of Treatment: Full Scope Treatment, no trach, no PEG  Code Status:  DNR  Prognosis:   Overall prognosis remains poor with progressing ALS. Eligible for hospice with no desire for trach/PEG.   Discharge Planning:  Hopeful to return home. Likely with hospice.    Thank you for allowing the Palliative Medicine Team to assist in the care of this patient.   Total Time 60 min Prolonged Time Billed  no       Greater than 50%  of this time was spent counseling and coordinating care related to the above assessment and plan.  Yong ChannelAlicia Evalin Shawhan, NP Palliative Medicine Team Pager # [phone] (M-F 8a-5p) Team Phone # [phone] (Nights/Weekends)

## 2017-07-18 NOTE — Progress Notes (Signed)
PULMONARY / CRITICAL CARE MEDICINE   Name: Tara Whitehead MRN: [id number] DOB: 19-Aug-1960    ADMISSION DATE:  07/11/2017 CONSULTATION DATE:  07/12/17  REFERRING MD:  Drs Lowell Guitar, Dr Annabell Howells  CHIEF COMPLAINT:  Acute resp failure  BRIEF:   57 year old woman with a history of ALS followed at [facility].  She has a history of dyspnea both at rest and with her ADLs.  She was under evaluation for nocturnal ventilation, Trilogy device based on office note from 06/26/17.  Also cough assistance device and suctioning at home.  She underwent cystoscopy for suprapubic catheter placement and Botox instillation to treat small, neurogenic bladder.  Postprocedural course has been complicated by dyspnea, hypoxemia, increased work of breathing and some left-sided chest discomfort.  She has been hypertensive post procedure.  1/18 she developed acute worsening of her dyspnea, placed on BiPAP and moved to the ICU.  Chest x-ray is rotated but shows significant left-sided volume loss, possible associated effusion, with elevated left hemidiaphragm (which is still visible).  She received empiric Lasix, was started on empiric broad-spectrum antibiotics prior to transfer.  Of note she was treated recently with a short course of antibiotics and a prednisone taper in the setting of an apparent upper respiratory infection, question whether this is evolved into pneumonia.  At the least it may have influenced her secretions and affected her secretion clearance, left-sided atelectasis    CULTURES: Blood 1/18 >>  ng  ANTIBIOTICS: Cefepime 1/18 >> Vancomycin 1/18 >> 1/21  SIGNIFICANT EVENTS: Cystoscopy, suprapubic catheter placement, Botox instillation into the bladder 1/18; Chest x-ray 1/18 >> left lower lobe atelectasis versus infiltrate with possible associated effusion  07/13/2017 - desatn on bipap 1/23  tolerating 10 L high flow nasal cannula PT was able to get her out of bed into her  wheelchair  SUBJECTIVE/OVERNIGHT/INTERVAL HX Worse hypoxia, did not tolerate BiPAP well overnight, back on nonrebreather Appears tired    VITAL SIGNS: BP (!) 148/122   Pulse (!) 106   Temp (!) 97.5 F (36.4 C) (Oral)   Resp (!) 23   Ht 5\' 11"  (1.803 m)   Wt 255 lb 4.7 oz (115.8 kg)   SpO2 95%   BMI 35.61 kg/m   HEMODYNAMICS:    VENTILATOR SETTINGS:    INTAKE / OUTPUT: I/O last 3 completed shifts: In: 1503.3 [P.O.:240; I.V.:903.3; Other:110; IV Piggyback:250] Out: 925 [Urine:925]  PHYSICAL EXAMINATION:   General Appearance:    Looks critically  ill OBESE   Head:    Normocephalic, without obvious abnormality, atraumatic  Eyes:    PERRL - yes, conjunctiva/corneas - clear      Ears:    Normal external ear canals, both ears        Neck:   Supple,  No enlargement/tenderness/nodules     Lungs:     Clear to auscultation, bilaterally decreased air movement at bases  Chest wall:    No deformity  Heart:    S1 and S2 normal, no murmur, CVP - no.  Pressors - no  Abdomen:     Soft, no masses, no organomegaly        Extremities:   Extremities- intact     Skin:   Intact in exposed areas .     Neurologic:  able to talk in short sentences, wasting of hand muscles     PULMONARY Recent Labs  Lab 07/12/17 1330 07/13/17 1040  PHART 7.449 7.440  PCO2ART 44.8 48.2*  PO2ART 61.8* 91.5  HCO3 30.6* 32.2*  O2SAT 91.7 96.6    CBC Recent Labs  Lab 07/15/17 0351 07/16/17 0324 07/17/17 0330  HGB 14.6 15.8* 16.6*  HCT 45.8 48.1* 52.6*  WBC 16.9* 13.5* 12.2*  PLT 221 211 163    COAGULATION Recent Labs  Lab 07/12/17 1158  INR 0.93    CARDIAC   Recent Labs  Lab 07/12/17 1000 07/12/17 1527 07/12/17 2154  TROPONINI <0.03 <0.03 <0.03   No results for input(s): PROBNP in the last 168 hours.   CHEMISTRY Recent Labs  Lab 07/13/17 0316 07/14/17 0236 07/15/17 0351 07/16/17 0324 07/17/17 0330 07/18/17 0328  NA 136 141 141 141 144  --   K 3.4* 3.9 4.0  3.9 3.9  --   CL 101 102 100* 102 103  --   CO2 27 31 32 30 31  --   GLUCOSE 140* 102* 99 92 92  --   BUN 17 17 16 16 16   --   CREATININE <0.30* 0.31*  0.32* <0.30* <0.30* <0.30* <0.30*  CALCIUM 8.7* 9.0 8.9 9.0 9.1  --   MG 1.8 2.0 1.8 1.9 2.0  --   PHOS  --  2.4* 2.7 2.9 3.0  --    CrCl cannot be calculated (This lab value cannot be used to calculate CrCl because it is not a number: <0.30).   LIVER Recent Labs  Lab 07/12/17 1158 07/15/17 0351 07/17/17 0330  AST 19 13* 18  ALT 22 13* 13*  ALKPHOS 95 74 69  BILITOT 0.5 1.0 0.5  PROT 7.7 6.7 7.0  ALBUMIN 3.8 3.0* 3.0*  INR 0.93  --   --      INFECTIOUS Recent Labs  Lab 07/12/17 1158 07/12/17 1527  07/13/17 1134 07/14/17 0236 07/15/17 0351  LATICACIDVEN 1.1 1.7  --   --   --   --   PROCALCITON <0.10  --    < > 0.54 0.38 0.18   < > = values in this interval not displayed.     ENDOCRINE CBG (last 3)  No results for input(s): GLUCAP in the last 72 hours.       IMAGING x48h  - image(s) personally visualized  -   highlighted in bold Dg Chest Port 1 View  Result Date: 07/17/2017 CLINICAL DATA:  Hypoxia EXAM: PORTABLE CHEST 1 VIEW COMPARISON:  07/16/2017 FINDINGS: Cardiac shadow is stable. The lungs are well aerated bilaterally with bibasilar infiltrative changes left greater than right. The overall appearance is stable from the prior exam. No new focal abnormality is seen. No bony abnormality is noted. IMPRESSION: Stable bibasilar infiltrates. Electronically Signed   By: Alcide Clever M.D.   On: 07/17/2017 07:17   Dg Swallowing Func-speech Pathology  Result Date: 07/17/2017 Objective Swallowing Evaluation: Type of Study: MBS-Modified Barium Swallow Study  Patient Details Name: SHAHRZAD KOBLE MRN: [id number] Date of Birth: May 29, 1961 Today's Date: 07/17/2017 Time: SLP Start Time (ACUTE ONLY): 1425 -SLP Stop Time (ACUTE ONLY): 1500 SLP Time Calculation (min) (ACUTE ONLY): 35 min Past Medical History: Past Medical  History: Diagnosis Date . ALS (amyotrophic lateral sclerosis) (HCC) 11/23/2016 . Anxiety  . Arthritis  . Complication of anesthesia   unable to do spinal at one of surgeries  . Depression  . Dyspnea   due to ALS  . Hypertension  . Neuromuscular disorder (HCC)   ALS  Past Surgical History: Past Surgical History: Procedure Laterality Date . APPENDECTOMY   . BILATERAL CARPAL TUNNEL RELEASE   . BOTOX INJECTION N/A 07/11/2017  Procedure: BOTOX  INJECTION;  Surgeon: Bjorn Pippin, MD;  Location: [facility]S;  Service: Urology;  Laterality: N/A; . INSERTION OF SUPRAPUBIC CATHETER N/A 07/11/2017  Procedure: INSERTION OF SUPRAPUBIC CATHETER;  Surgeon: Bjorn Pippin, MD;  Location: [facility];  Service: Urology;  Laterality: N/A; . JOINT REPLACEMENT    3 knee replacements HPI: 57 yo female with ALS admitted to [facility] with respiratory failure.   Pt required Bipap and some discussion re: possible trach/PEG took place.  Today pt is on high flow oxygen and has been off Bipap.  Swallow eval ordered and pt seen yesterday for clinical evaluation.  Follow-up indicated to determine readiness for instrumental swallow evaluation.    Subjective: pt awake in chair Assessment / Plan / Recommendation CHL IP CLINICAL IMPRESSIONS 07/17/2017 Clinical Impression  Patient presents with mild oropharyngeal dysphagia with decreased oral bolus cohesion, premature spillage into pharynx and mild tongue base residuals.  Often pt would initiate dry swallow to facilitate oropharyngeal clearance.  Decreased laryngeal elevation/closure results in laryngeal penetration and trace aspiration of thin liquids and penetration of nectar.  Trace secretions noted to mix with barium spilling into larynx/trachea.  Chin tuck posture decreased penetration but did not fully prevent it consistently.  In addition increased viscocity of any bolus resulted in worsening backflow through UES without pt adequate sensation.  Again dry swallow helpful to decrease.  Pharyngeal swallow with  solids/puree was strong without penetration or aspiration nor significant residuals.  Pt did not sense trace aspiration of thin - causing SLP to suspect low grade chronic aspiration of secretions and water intake.  Although pt only tracely aspirated, she has increased risk of pulmonary compromise with any aspiration.  Unfortunately pt could not consistently clear penetrates fully with cued cough/throat clear.  Due to pt's weakness from her ALS, she is aspirating secretions and given level of pulmonary weakness, minimal aspiration concerning for pulmonary clearance.  Educated pt/family to findings/recommendations using teach back.  Informed pt and spouse that feeding tube would not prevent her from aspiration of secretions.    If pt desires diet and MDs are agreeable, would recommend dys3/thin - using chin tuck, follow solids with liquids, multiple swallows and medicine with puree - whole.  SLP to follow up for RMST to strengthen cough/airway protection and subsequent submental musculature to aid swallow.  Pt continued to indicate her initial reaction after MBS was that she did not want a feeding tube.   SLP Visit Diagnosis Dysphagia, oropharyngeal phase (R13.12);Dysphagia, pharyngoesophageal phase (R13.14) Attention and concentration deficit following -- Frontal lobe and executive function deficit following -- Impact on safety and function Mild aspiration risk   CHL IP TREATMENT RECOMMENDATION 07/17/2017 Treatment Recommendations Therapy as outlined in treatment plan below   Prognosis 07/17/2017 Prognosis for Safe Diet Advancement Fair Barriers to Reach Goals Severity of deficits;Other (Comment) Barriers/Prognosis Comment -- CHL IP DIET RECOMMENDATION 07/17/2017 SLP Diet Recommendations (No Data) Liquid Administration via -- Medication Administration Whole meds with puree Compensations Slow rate;Small sips/bites;Chin tuck;Follow solids with liquid Postural Changes Remain semi-upright after after feeds/meals  (Comment);Seated upright at 90 degrees   CHL IP OTHER RECOMMENDATIONS 07/17/2017 Recommended Consults -- Oral Care Recommendations Oral care QID Other Recommendations --   CHL IP FOLLOW UP RECOMMENDATIONS 07/17/2017 Follow up Recommendations (No Data)   CHL IP FREQUENCY AND DURATION 07/17/2017 Speech Therapy Frequency (ACUTE ONLY) min 2x/week Treatment Duration 1 week      CHL IP ORAL PHASE 07/17/2017 Oral Phase Impaired Oral - Pudding Teaspoon -- Oral - Pudding Cup -- Oral -  Honey Teaspoon -- Oral - Honey Cup -- Oral - Nectar Teaspoon -- Oral - Nectar Cup Reduced posterior propulsion;Premature spillage;Lingual/palatal residue;Decreased bolus cohesion Oral - Nectar Straw Premature spillage;Piecemeal swallowing;Lingual/palatal residue;Decreased bolus cohesion Oral - Thin Teaspoon Premature spillage;Piecemeal swallowing;Lingual/palatal residue;Decreased bolus cohesion Oral - Thin Cup Premature spillage;Piecemeal swallowing;Lingual/palatal residue;Decreased bolus cohesion Oral - Thin Straw Piecemeal swallowing;Lingual/palatal residue;Decreased bolus cohesion Oral - Puree Piecemeal swallowing;Premature spillage;Lingual/palatal residue Oral - Mech Soft Decreased bolus cohesion;Premature spillage;Piecemeal swallowing;Lingual/palatal residue Oral - Regular -- Oral - Multi-Consistency -- Oral - Pill Lingual/palatal residue;Piecemeal swallowing;Premature spillage Oral Phase - Comment --  CHL IP PHARYNGEAL PHASE 07/17/2017 Pharyngeal Phase Impaired Pharyngeal- Pudding Teaspoon -- Pharyngeal -- Pharyngeal- Pudding Cup -- Pharyngeal -- Pharyngeal- Honey Teaspoon -- Pharyngeal -- Pharyngeal- Honey Cup -- Pharyngeal -- Pharyngeal- Nectar Teaspoon -- Pharyngeal -- Pharyngeal- Nectar Cup Reduced airway/laryngeal closure;Penetration/Aspiration during swallow;Pharyngeal residue - pyriform;Pharyngeal residue - cp segment Pharyngeal Material enters airway, remains ABOVE vocal cords and not ejected out Pharyngeal- Nectar Straw Delayed  swallow initiation-pyriform sinuses;Reduced laryngeal elevation;Reduced airway/laryngeal closure;Penetration/Aspiration during swallow;Penetration/Aspiration before swallow;Pharyngeal residue - pyriform;Pharyngeal residue - cp segment Pharyngeal Material enters airway, remains ABOVE vocal cords and not ejected out Pharyngeal- Thin Teaspoon Reduced laryngeal elevation;Reduced airway/laryngeal closure;Pharyngeal residue - cp segment;Pharyngeal residue - pyriform;Penetration/Aspiration during swallow Pharyngeal Material enters airway, remains ABOVE vocal cords and not ejected out Pharyngeal- Thin Cup Reduced laryngeal elevation;Reduced airway/laryngeal closure;Pharyngeal residue - pyriform;Pharyngeal residue - cp segment;Penetration/Aspiration during swallow Pharyngeal Material enters airway, passes BELOW cords without attempt by patient to eject out (silent aspiration) Pharyngeal- Thin Straw Reduced laryngeal elevation;Reduced airway/laryngeal closure;Reduced tongue base retraction;Pharyngeal residue - pyriform;Pharyngeal residue - cp segment Pharyngeal -- Pharyngeal- Puree Reduced laryngeal elevation;Reduced airway/laryngeal closure;Pharyngeal residue - pyriform;Pharyngeal residue - cp segment Pharyngeal -- Pharyngeal- Mechanical Soft Reduced laryngeal elevation;Reduced airway/laryngeal closure;Pharyngeal residue - pyriform;Pharyngeal residue - cp segment Pharyngeal -- Pharyngeal- Regular -- Pharyngeal -- Pharyngeal- Multi-consistency -- Pharyngeal -- Pharyngeal- Pill Reduced laryngeal elevation;Reduced airway/laryngeal closure;Pharyngeal residue - cp segment;Pharyngeal residue - pyriform Pharyngeal -- Pharyngeal Comment pt with backflow of barium from UES without awareness, dry swallows helpful to decrease, chin tuck posture did not fully prevent penetration, cued cough/throat clear not consistently effective to clear TRACE penetrates and TRACE aspiration   CHL IP CERVICAL ESOPHAGEAL PHASE 07/17/2017 Cervical  Esophageal Phase Impaired Pudding Teaspoon -- Pudding Cup -- Honey Teaspoon -- Honey Cup -- Nectar Teaspoon -- Nectar Cup Reduced cricopharyngeal relaxation;Esophageal backflow into the pharynx Nectar Straw Reduced cricopharyngeal relaxation;Esophageal backflow into the pharynx Thin Teaspoon Reduced cricopharyngeal relaxation;Esophageal backflow into the pharynx Thin Cup Reduced cricopharyngeal relaxation;Esophageal backflow into the pharynx Thin Straw Esophageal backflow into the pharynx;Reduced cricopharyngeal relaxation Puree Reduced cricopharyngeal relaxation;Esophageal backflow into the pharynx Mechanical Soft Esophageal backflow into the pharynx;Reduced cricopharyngeal relaxation Regular -- Multi-consistency -- Pill Reduced cricopharyngeal relaxation;Esophageal backflow into the pharynx Cervical Esophageal Comment could not sweep further distally into esophagus secondary to pt being in her motorized wheelchair No flowsheet data found. Chales Abrahams 07/17/2017, 4:48 PM    Donavan Burnet, MS [facility] SLP [phone]           Anti-infectives (From admission, onward)   Start     Dose/Rate Route Frequency Ordered Stop   07/12/17 2200  vancomycin (VANCOCIN) IVPB 750 mg/150 ml premix  Status:  Discontinued     750 mg 150 mL/hr over 60 Minutes Intravenous Every 12 hours 07/12/17 1221 07/14/17 1003   07/12/17 2200  ceFEPIme (MAXIPIME) 1 g in dextrose 5 % 50 mL IVPB     1  g 100 mL/hr over 30 Minutes Intravenous Every 8 hours 07/12/17 1221     07/12/17 1300  ceFEPIme (MAXIPIME) 2 g in dextrose 5 % 50 mL IVPB     2 g 100 mL/hr over 30 Minutes Intravenous  Once 07/12/17 1144 07/12/17 1522   07/12/17 1300  vancomycin (VANCOCIN) 2,000 mg in sodium chloride 0.9 % 500 mL IVPB     2,000 mg 250 mL/hr over 120 Minutes Intravenous  Once 07/12/17 1144 07/12/17 1633   07/12/17 1000  doxycycline (VIBRA-TABS) tablet 100 mg  Status:  Discontinued     100 mg Oral Every 12 hours 07/12/17 0946 07/12/17 1229   07/11/17 1800   ceFAZolin (ANCEF) IVPB 2g/100 mL premix     2 g 200 mL/hr over 30 Minutes Intravenous Every 8 hours 07/11/17 1448 07/12/17 0340   07/11/17 0700  ceFAZolin (ANCEF) IVPB 2g/100 mL premix     2 g 200 mL/hr over 30 Minutes Intravenous 30 min pre-op 07/11/17 0700 07/11/17 0915       LINES/TUBES:   DISCUSSION:  ASSESSMENT / PLAN:  PULMONARY A: Acute on chronic respiratory failure due to ALS, possibly exacerbated by her recent procedure/sedation with left-sided atelectasis &  pneumonia.   She does have chronic left-sided weakness and may have chronically abnormal left hemidiaphragm excursion 1/24 worsening hypoxia  P:   Continue BiPAP qhs , prn daytime, use HFNC daytime Aggressive chest PT, chest vest Incentive spirometry Have asked her husband to check with DME on status of trilogy machine She may also benefit from a cough assist device They have  obtained some equipment from a neighbor that passed from ALS pCXR today- not a candidate for bronchoscopy  CARDIOVASCULAR A:  Hypertension P:   hydralazine,labetalol prn Restart PO amlodipin   RENAL / UROLOGICAL A:   Neurogenic bladder status post suprapubic catheter, Botox instillation 1/17 P:   Follow urine output, BMP, replace electrolytes as indicated Suprapubic catheter management per urology    GASTROINTESTINAL A:   SUP Constipation Protein calorie malnutrition,mild P:   Add PPI dys 3 diet Dulcolax prn suppository   INFECTIOUS A:   Left sided infiltrate, possible HCAP - PCT decreasing P:    cefepime x 7-10 ds    NEUROLOGIC A:   [facility] Depression Anxiety P:   Wellbutrin and Effexor Ativan prn PT following   FAMILY  - Updates:DNR issued Palliative care to continue goals of care conversation  - Inter-disciplinary family meet or Palliative Care meeting due by:  07/19/16    Comer Locketakesh V. Vassie LollAlva MD  07/18/2017 10:02 AM

## 2017-07-18 NOTE — Progress Notes (Signed)
Pt seen, asleep and appears comfortable, no distress/increased wob noted.  Pt found on 10L salter/hfnc, HR97, rr24, spo2 96%.  Pt requested to come off bipap around 0030 per RN.  Bipap remains in room on standby.  RT will continue to monitor and assess as needed.

## 2017-07-18 NOTE — Progress Notes (Signed)
Date: July 18, 2017 Jahki Witham, BSN, RN3, CCM [phone] Chart and notes review for patient progress and needs. Will follow for case management and discharge needs. No cm or discharge needs present at time of this review. Next review date: [id number] 

## 2017-07-18 NOTE — Progress Notes (Signed)
PROGRESS NOTE  Tara Whitehead ZOX:[id number] DOB: 01/06/1961 DOA: 07/11/2017 PCP: Philemon Kingdom, MD  HPI/Recap of past 24 hours:  Tara Whitehead is a 57 y.o. year old female with medical history significant for ALS, anxiety, depression, HTN, arthritis, s/p knee replacements who presented on 07/11/2017 with SOB and HTN at 1 day postop suprapubic catheter placement and intravesical botox injection requiring placement on BiPAP and prompting transfer to ICU.  She was started empirically on broad-spectrum antibiotics, given empiric Lasix, received chest x-ray which showed possible pleural effusion with elevated left hemidiaphragm.  Has required intermittent use of BiPAP.     This morning,no complaints. Was on Bipap overnight and has been off for for a few hours this morning. Would like to sit at bedside today similar to last night. Very weak cough.   Assessment/Plan: Active Problems:   Neurogenic bladder disorder   Pressure injury of skin   SOB (shortness of breath)   Acute on chronic respiratory failure with hypoxia (HCC)   HCAP (healthcare-associated pneumonia)   ALS (amyotrophic lateral sclerosis) (HCC)   Goals of care, counseling/discussion   Palliative care encounter  Acute on chronic respiratory failure, multifactorial etiology suspect primary driver progression of ALS; however cannot rule out HCAP in setting of acute leukocytosis and bibasilar infiltrates chest x-ray, stable.  Likely current symptoms exacerbated from recent procedure. No respiratory distress on high flow nasal canula currently but still requiring intermittent BiPAP. PE thought less likely with improvement in sinus tachycardia.  Continue supportive care with aggressive chest PT, Incentive spirometry, encourage out of bed to chair,  PCCM following, empiric cefepime we will continue to monitor and follow blood cultures.  ALS, guarded prognosis.  Continue to have goals of care discussion with health care team and  palliative care consultants.  Her primary concern is not passing for her mother who has end-stage multiple sclerosis.  Previously patient not interested in trach/PEG or tube feeds however she didn't expect her prognosis to worsen this quickly and would prefer not to pass away before her mother.  Neurogenic bladder status post suprapubic catheter, Botox instillation on 07/11/17. Management per urology, continue to follow urine output and monitor electrolytes on BMP.  Sinus tachycardia, suspect related to dehydration, improving.  Likely worse in the setting of n.p.o. status while awaiting swallow studies.  Has improved with timed IVF. Encouraged patient to continue to stay hydrated  Hypertension, not controlled. Likely secondary to holding home BP meds in setting of previous NPO status while awaiting swallow study. Scheduled to start this morning her home amlodipine.  Depression/anxiety.  Home Wellbutrin and Effexor passed modified barium swallow.  Code Status: Full code  Family Communication: Family updated at bedisde  Disposition Plan: Monitor respiratory status, once able to tolerate being off BiPAP and stabilizing on nasal cannula, eventually de-escalate IV antibiotics to oral regimen  Consultants: Palliative care, PCCM  Procedures:  TTE on 07/12/17: EF 60-65%.  No regional wall motion normalities.  Grade 1 diastolic dysfunction.  Antimicrobials:  Cefepime 1/18>>>  Doxycycline 1/18  Vancomycin 1/18-1/20   Cultures:  07/12/17: Blood culture x2- growth to date   DVT prophylaxis: SCDs   Objective: Vitals:   07/18/17 0758 07/18/17 0800 07/18/17 0808 07/18/17 0900  BP:  (!) 154/78  (!) 148/122  Pulse:      Resp:  (!) 27 (!) 25 (!) 23  Temp:   (!) 97.5 F (36.4 C)   TempSrc:   Oral   SpO2: (!) 89% (!) 87% 96% 95%  Weight:      Height:        Intake/Output Summary (Last 24 hours) at 07/18/2017 0904 Last data filed at 07/18/2017 0600 Gross per 24 hour  Intake 1293.33 ml   Output 525 ml  Net 768.33 ml   Filed Weights   07/11/17 0720 07/17/17 1900 07/18/17 0600  Weight: 117.9 kg (260 lb) 115.8 kg (255 lb 4.7 oz) 115.8 kg (255 lb 4.7 oz)    Exam:  General: obese female, lying in bed, no apparent distress Eyes: EOMI Neck: normal, no appreciable JVD Cardiovascular: Tachycardic, no appreciable murmurs, rubs or gallops  Respiratory: On HF nasal cannula,no respiratory distress, decreased effort and breath sounds throughout. No appreciable rhonchi, wheezing, or rales Skin: No Rash Musculoskeletal:No clubbing / cyanosis.  Neurologic: Mental status AAOx3, speech normal, Psychiatric:Appropriate affect, and mood  Data Reviewed: CBC: Recent Labs  Lab 07/13/17 0316 07/14/17 0236 07/15/17 0351 07/16/17 0324 07/17/17 0330  WBC 38.9* 23.7* 16.9* 13.5* 12.2*  NEUTROABS  --  21.2* 14.4* 11.2* 9.3*  HGB 16.3* 15.2* 14.6 15.8* 16.6*  HCT 47.6* 47.1* 45.8 48.1* 52.6*  MCV 87.0 88.0 90.0 89.6 91.3  PLT 255 225 221 211 163   Basic Metabolic Panel: Recent Labs  Lab 07/13/17 0316 07/14/17 0236 07/15/17 0351 07/16/17 0324 07/17/17 0330 07/18/17 0328  NA 136 141 141 141 144  --   K 3.4* 3.9 4.0 3.9 3.9  --   CL 101 102 100* 102 103  --   CO2 27 31 32 30 31  --   GLUCOSE 140* 102* 99 92 92  --   BUN 17 17 16 16 16   --   CREATININE <0.30* 0.31*  0.32* <0.30* <0.30* <0.30* <0.30*  CALCIUM 8.7* 9.0 8.9 9.0 9.1  --   MG 1.8 2.0 1.8 1.9 2.0  --   PHOS  --  2.4* 2.7 2.9 3.0  --    GFR: CrCl cannot be calculated (This lab value cannot be used to calculate CrCl because it is not a number: <0.30). Liver Function Tests: Recent Labs  Lab 07/12/17 1158 07/15/17 0351 07/17/17 0330  AST 19 13* 18  ALT 22 13* 13*  ALKPHOS 95 74 69  BILITOT 0.5 1.0 0.5  PROT 7.7 6.7 7.0  ALBUMIN 3.8 3.0* 3.0*   No results for input(s): LIPASE, AMYLASE in the last 168 hours. No results for input(s): AMMONIA in the last 168 hours. Coagulation Profile: Recent Labs  Lab  07/12/17 1158  INR 0.93   Cardiac Enzymes: Recent Labs  Lab 07/12/17 1000 07/12/17 1527 07/12/17 2154  TROPONINI <0.03 <0.03 <0.03   BNP (last 3 results) No results for input(s): PROBNP in the last 8760 hours. HbA1C: No results for input(s): HGBA1C in the last 72 hours. CBG: Recent Labs  Lab 07/14/17 0824  GLUCAP 99   Lipid Profile: No results for input(s): CHOL, HDL, LDLCALC, TRIG, CHOLHDL, LDLDIRECT in the last 72 hours. Thyroid Function Tests: No results for input(s): TSH, T4TOTAL, FREET4, T3FREE, THYROIDAB in the last 72 hours. Anemia Panel: No results for input(s): VITAMINB12, FOLATE, FERRITIN, TIBC, IRON, RETICCTPCT in the last 72 hours. Urine analysis: No results found for: COLORURINE, APPEARANCEUR, LABSPEC, PHURINE, GLUCOSEU, HGBUR, BILIRUBINUR, KETONESUR, PROTEINUR, UROBILINOGEN, NITRITE, LEUKOCYTESUR Sepsis Labs: @LABRCNTIP (procalcitonin:4,lacticidven:4)  ) Recent Results (from the past 240 hour(s))  Culture, blood (routine x 2)     Status: None   Collection Time: 07/12/17 11:55 AM  Result Value Ref Range Status   Specimen Description BLOOD RIGHT ANTECUBITAL  Final   Special Requests   Final    BOTTLES DRAWN AEROBIC AND ANAEROBIC Blood Culture adequate volume   Culture   Final    NO GROWTH 5 DAYS Performed at [facility], [address], [place], [place] [address]    Report Status 07/17/2017 FINAL  Final  Culture, blood (routine x 2)     Status: None   Collection Time: 07/12/17 11:55 AM  Result Value Ref Range Status   Specimen Description BLOOD BLOOD LEFT HAND  Final   Special Requests IN PEDIATRIC BOTTLE Blood Culture adequate volume  Final   Culture   Final    NO GROWTH 5 DAYS Performed at [facility], [address] [place], [place] [address]    Report Status 07/17/2017 FINAL  Final  MRSA PCR Screening     Status: None   Collection Time: 07/12/17 12:24 PM  Result Value Ref Range Status   MRSA by PCR NEGATIVE NEGATIVE Final     Comment:        The GeneXpert MRSA Assay (FDA approved for NASAL specimens only), is one component of a comprehensive MRSA colonization surveillance program. It is not intended to diagnose MRSA infection nor to guide or monitor treatment for MRSA infections.       Studies: Dg Swallowing Func-speech Pathology  Result Date: 07/17/2017 Objective Swallowing Evaluation: Type of Study: MBS-Modified Barium Swallow Study  Patient Details Name: SHARNETTA GIELOW MRN: [id number] Date of Birth: 1960-12-24 Today's Date: 07/17/2017 Time: SLP Start Time (ACUTE ONLY): 1425 -SLP Stop Time (ACUTE ONLY): 1500 SLP Time Calculation (min) (ACUTE ONLY): 35 min Past Medical History: Past Medical History: Diagnosis Date . ALS (amyotrophic lateral sclerosis) (HCC) 11/23/2016 . Anxiety  . Arthritis  . Complication of anesthesia   unable to do spinal at one of surgeries  . Depression  . Dyspnea   due to ALS  . Hypertension  . Neuromuscular disorder (HCC)   ALS  Past Surgical History: Past Surgical History: Procedure Laterality Date . APPENDECTOMY   . BILATERAL CARPAL TUNNEL RELEASE   . BOTOX INJECTION N/A 07/11/2017  Procedure: BOTOX INJECTION;  Surgeon: Bjorn Pippin, MD;  Location: [facility];  Service: Urology;  Laterality: N/A; . INSERTION OF SUPRAPUBIC CATHETER N/A 07/11/2017  Procedure: INSERTION OF SUPRAPUBIC CATHETER;  Surgeon: Bjorn Pippin, MD;  Location: [facility];  Service: Urology;  Laterality: N/A; . JOINT REPLACEMENT    3 knee replacements HPI: 57 yo female with ALS admitted to [facility] with respiratory failure.   Pt required Bipap and some discussion re: possible trach/PEG took place.  Today pt is on high flow oxygen and has been off Bipap.  Swallow eval ordered and pt seen yesterday for clinical evaluation.  Follow-up indicated to determine readiness for instrumental swallow evaluation.    Subjective: pt awake in chair Assessment / Plan / Recommendation CHL IP CLINICAL IMPRESSIONS 07/17/2017 Clinical Impression  Patient presents  with mild oropharyngeal dysphagia with decreased oral bolus cohesion, premature spillage into pharynx and mild tongue base residuals.  Often pt would initiate dry swallow to facilitate oropharyngeal clearance.  Decreased laryngeal elevation/closure results in laryngeal penetration and trace aspiration of thin liquids and penetration of nectar.  Trace secretions noted to mix with barium spilling into larynx/trachea.  Chin tuck posture decreased penetration but did not fully prevent it consistently.  In addition increased viscocity of any bolus resulted in worsening backflow through UES without pt adequate sensation.  Again dry swallow helpful to decrease.  Pharyngeal  swallow with solids/puree was strong without penetration or aspiration nor significant residuals.  Pt did not sense trace aspiration of thin - causing SLP to suspect low grade chronic aspiration of secretions and water intake.  Although pt only tracely aspirated, she has increased risk of pulmonary compromise with any aspiration.  Unfortunately pt could not consistently clear penetrates fully with cued cough/throat clear.  Due to pt's weakness from her ALS, she is aspirating secretions and given level of pulmonary weakness, minimal aspiration concerning for pulmonary clearance.  Educated pt/family to findings/recommendations using teach back.  Informed pt and spouse that feeding tube would not prevent her from aspiration of secretions.    If pt desires diet and MDs are agreeable, would recommend dys3/thin - using chin tuck, follow solids with liquids, multiple swallows and medicine with puree - whole.  SLP to follow up for RMST to strengthen cough/airway protection and subsequent submental musculature to aid swallow.  Pt continued to indicate her initial reaction after MBS was that she did not want a feeding tube.   SLP Visit Diagnosis Dysphagia, oropharyngeal phase (R13.12);Dysphagia, pharyngoesophageal phase (R13.14) Attention and concentration deficit  following -- Frontal lobe and executive function deficit following -- Impact on safety and function Mild aspiration risk   CHL IP TREATMENT RECOMMENDATION 07/17/2017 Treatment Recommendations Therapy as outlined in treatment plan below   Prognosis 07/17/2017 Prognosis for Safe Diet Advancement Fair Barriers to Reach Goals Severity of deficits;Other (Comment) Barriers/Prognosis Comment -- CHL IP DIET RECOMMENDATION 07/17/2017 SLP Diet Recommendations (No Data) Liquid Administration via -- Medication Administration Whole meds with puree Compensations Slow rate;Small sips/bites;Chin tuck;Follow solids with liquid Postural Changes Remain semi-upright after after feeds/meals (Comment);Seated upright at 90 degrees   CHL IP OTHER RECOMMENDATIONS 07/17/2017 Recommended Consults -- Oral Care Recommendations Oral care QID Other Recommendations --   CHL IP FOLLOW UP RECOMMENDATIONS 07/17/2017 Follow up Recommendations (No Data)   CHL IP FREQUENCY AND DURATION 07/17/2017 Speech Therapy Frequency (ACUTE ONLY) min 2x/week Treatment Duration 1 week      CHL IP ORAL PHASE 07/17/2017 Oral Phase Impaired Oral - Pudding Teaspoon -- Oral - Pudding Cup -- Oral - Honey Teaspoon -- Oral - Honey Cup -- Oral - Nectar Teaspoon -- Oral - Nectar Cup Reduced posterior propulsion;Premature spillage;Lingual/palatal residue;Decreased bolus cohesion Oral - Nectar Straw Premature spillage;Piecemeal swallowing;Lingual/palatal residue;Decreased bolus cohesion Oral - Thin Teaspoon Premature spillage;Piecemeal swallowing;Lingual/palatal residue;Decreased bolus cohesion Oral - Thin Cup Premature spillage;Piecemeal swallowing;Lingual/palatal residue;Decreased bolus cohesion Oral - Thin Straw Piecemeal swallowing;Lingual/palatal residue;Decreased bolus cohesion Oral - Puree Piecemeal swallowing;Premature spillage;Lingual/palatal residue Oral - Mech Soft Decreased bolus cohesion;Premature spillage;Piecemeal swallowing;Lingual/palatal residue Oral - Regular -- Oral  - Multi-Consistency -- Oral - Pill Lingual/palatal residue;Piecemeal swallowing;Premature spillage Oral Phase - Comment --  CHL IP PHARYNGEAL PHASE 07/17/2017 Pharyngeal Phase Impaired Pharyngeal- Pudding Teaspoon -- Pharyngeal -- Pharyngeal- Pudding Cup -- Pharyngeal -- Pharyngeal- Honey Teaspoon -- Pharyngeal -- Pharyngeal- Honey Cup -- Pharyngeal -- Pharyngeal- Nectar Teaspoon -- Pharyngeal -- Pharyngeal- Nectar Cup Reduced airway/laryngeal closure;Penetration/Aspiration during swallow;Pharyngeal residue - pyriform;Pharyngeal residue - cp segment Pharyngeal Material enters airway, remains ABOVE vocal cords and not ejected out Pharyngeal- Nectar Straw Delayed swallow initiation-pyriform sinuses;Reduced laryngeal elevation;Reduced airway/laryngeal closure;Penetration/Aspiration during swallow;Penetration/Aspiration before swallow;Pharyngeal residue - pyriform;Pharyngeal residue - cp segment Pharyngeal Material enters airway, remains ABOVE vocal cords and not ejected out Pharyngeal- Thin Teaspoon Reduced laryngeal elevation;Reduced airway/laryngeal closure;Pharyngeal residue - cp segment;Pharyngeal residue - pyriform;Penetration/Aspiration during swallow Pharyngeal Material enters airway, remains ABOVE vocal cords and not ejected out Pharyngeal- Thin Cup  Reduced laryngeal elevation;Reduced airway/laryngeal closure;Pharyngeal residue - pyriform;Pharyngeal residue - cp segment;Penetration/Aspiration during swallow Pharyngeal Material enters airway, passes BELOW cords without attempt by patient to eject out (silent aspiration) Pharyngeal- Thin Straw Reduced laryngeal elevation;Reduced airway/laryngeal closure;Reduced tongue base retraction;Pharyngeal residue - pyriform;Pharyngeal residue - cp segment Pharyngeal -- Pharyngeal- Puree Reduced laryngeal elevation;Reduced airway/laryngeal closure;Pharyngeal residue - pyriform;Pharyngeal residue - cp segment Pharyngeal -- Pharyngeal- Mechanical Soft Reduced laryngeal  elevation;Reduced airway/laryngeal closure;Pharyngeal residue - pyriform;Pharyngeal residue - cp segment Pharyngeal -- Pharyngeal- Regular -- Pharyngeal -- Pharyngeal- Multi-consistency -- Pharyngeal -- Pharyngeal- Pill Reduced laryngeal elevation;Reduced airway/laryngeal closure;Pharyngeal residue - cp segment;Pharyngeal residue - pyriform Pharyngeal -- Pharyngeal Comment pt with backflow of barium from UES without awareness, dry swallows helpful to decrease, chin tuck posture did not fully prevent penetration, cued cough/throat clear not consistently effective to clear TRACE penetrates and TRACE aspiration   CHL IP CERVICAL ESOPHAGEAL PHASE 07/17/2017 Cervical Esophageal Phase Impaired Pudding Teaspoon -- Pudding Cup -- Honey Teaspoon -- Honey Cup -- Nectar Teaspoon -- Nectar Cup Reduced cricopharyngeal relaxation;Esophageal backflow into the pharynx Nectar Straw Reduced cricopharyngeal relaxation;Esophageal backflow into the pharynx Thin Teaspoon Reduced cricopharyngeal relaxation;Esophageal backflow into the pharynx Thin Cup Reduced cricopharyngeal relaxation;Esophageal backflow into the pharynx Thin Straw Esophageal backflow into the pharynx;Reduced cricopharyngeal relaxation Puree Reduced cricopharyngeal relaxation;Esophageal backflow into the pharynx Mechanical Soft Esophageal backflow into the pharynx;Reduced cricopharyngeal relaxation Regular -- Multi-consistency -- Pill Reduced cricopharyngeal relaxation;Esophageal backflow into the pharynx Cervical Esophageal Comment could not sweep further distally into esophagus secondary to pt being in her motorized wheelchair No flowsheet data found. Chales Abrahams 07/17/2017, 4:48 PM    Donavan Burnet, MS [facility] SLP [phone]            Scheduled Meds: . amLODipine  5 mg Oral Daily  . buPROPion  150 mg Oral Daily  . enoxaparin (LOVENOX) injection  60 mg Subcutaneous Q24H  . mouth rinse  15 mL Mouth Rinse BID  . mouth rinse  15 mL Mouth Rinse q12n4p  .  nicotine  14 mg Transdermal Daily  . venlafaxine XR  225 mg Oral Q breakfast    Continuous Infusions: . sodium chloride 100 mL/hr at 07/18/17 0500  . ceFEPime (MAXIPIME) IV 1 g (07/18/17 0557)     LOS: 6 days     Tara Peace, MD [facility] Pager [phone]  If 7PM-7AM, please contact night-coverage [email or web address] Password [facility] 07/18/2017, 9:04 AM

## 2017-07-18 NOTE — Evaluation (Signed)
SLP Cancellation Note  Patient Details Name: Tara Whitehead MRN: [id number] DOB: [birth year]   Cancelled Nyra Jabstreatment:       Reason Eval/Treat Not Completed: Other (comment)(pt with respiratory difficulties this am, will defer RMST today, provided RN with notes)   Chales AbrahamsKimball, Sarina Robleto Ann 07/18/2017, 12:13 PM   Donavan Burnetamara Chima Astorino, MS [facility] SLP [phone]

## 2017-07-19 DIAGNOSIS — N319 Neuromuscular dysfunction of bladder, unspecified: Principal | ICD-10-CM

## 2017-07-19 DIAGNOSIS — J9601 Acute respiratory failure with hypoxia: Secondary | ICD-10-CM

## 2017-07-19 DIAGNOSIS — Z7189 Other specified counseling: Secondary | ICD-10-CM

## 2017-07-19 LAB — CREATININE, SERUM: Creatinine, Ser: <0.3 mg/dL — ABNORMAL LOW (ref 0.44–1.00)

## 2017-07-19 MED ORDER — AMLODIPINE BESYLATE 10 MG PO TABS
10.0000 mg | ORAL_TABLET | Freq: Every day | ORAL | Status: DC
  Administered 2017-07-19 – 2017-07-23 (×5): 10 mg via ORAL
  Filled 2017-07-19 (×5): qty 1

## 2017-07-19 NOTE — Progress Notes (Signed)
PULMONARY / CRITICAL CARE MEDICINE   Name: Tara Whitehead MRN: [id number] DOB: [birth year]    ADMISSION DATE:  07/11/2017 CONSULTATION DATE:  07/12/17  REFERRING MD:  Drs Lowell Guitar, Dr Annabell Howells  CHIEF COMPLAINT:  Acute resp failure  BRIEF:   57 year old woman with a history of ALS followed at [facility].  She has a history of dyspnea both at rest and with her ADLs.  She was under evaluation for nocturnal ventilation, Trilogy device based on office note from 06/26/17.  Also cough assistance device and suctioning at home.  She underwent cystoscopy for suprapubic catheter placement and Botox instillation to treat small, neurogenic bladder.  Postprocedural course has been complicated by dyspnea, hypoxemia, increased work of breathing and some left-sided chest discomfort.  She has been hypertensive post procedure.  1/18 she developed acute worsening of her dyspnea, placed on BiPAP and moved to the ICU.  Chest x-ray >> lt infx + left-sided volume loss, possible associated effusion, with elevated left hemidiaphragm (which is still visible).  She received empiric Lasix, was started on empiric broad-spectrum antibiotics prior to transfer.     CULTURES: Blood 1/18 >>  ng  ANTIBIOTICS: Cefepime 1/18 >> Vancomycin 1/18 >> 1/21  SIGNIFICANT EVENTS: Cystoscopy, suprapubic catheter placement, Botox instillation into the bladder 1/18; Chest x-ray 1/18 >> left lower lobe atelectasis versus infiltrate with possible associated effusion  07/13/2017 - desatn on bipap 1/23  tolerating 10 L high flow nasal cannula PT was able to get her out of bed into her wheelchair  SUBJECTIVE/OVERNIGHT/INTERVAL HX Used bipap overnight On HFNC this am    VITAL SIGNS: BP (!) 183/99   Pulse 99   Temp 97.9 F (36.6 C) (Oral)   Resp (!) 26   Ht 5\' 11"  (1.803 m)   Wt 287 lb 7.7 oz (130.4 kg)   SpO2 100%   BMI 40.10 kg/m   HEMODYNAMICS:    VENTILATOR SETTINGS: FiO2 (%):  [60 %] 60 %  INTAKE / OUTPUT: I/O last 3  completed shifts: In: 1393.3 [P.O.:240; I.V.:903.3; IV Piggyback:250] Out: 850 [Urine:850]  PHYSICAL EXAMINATION:   General Appearance:    Looks critically  ill OBESE , tired  Head:    Normocephalic, without obvious abnormality, atraumatic  Eyes:    PERRL - yes, conjunctiva/corneas - clear      Ears:    Normal external ear canals, both ears        Neck:   Supple,  No enlargement/tenderness/nodules     Lungs:     bilaterally decreased air movement at bases     Heart:    S1 and S2 normal, no murmur, CVP - no.  Pressors - no  Abdomen:     Soft, no masses, no organomegaly        Extremities:   Extremities- intact     Skin:   Intact in exposed areas .     Neurologic:  able to talk in short sentences, wasting of hand muscles     PULMONARY Recent Labs  Lab 07/12/17 1330 07/13/17 1040  PHART 7.449 7.440  PCO2ART 44.8 48.2*  PO2ART 61.8* 91.5  HCO3 30.6* 32.2*  O2SAT 91.7 96.6    CBC Recent Labs  Lab 07/15/17 0351 07/16/17 0324 07/17/17 0330  HGB 14.6 15.8* 16.6*  HCT 45.8 48.1* 52.6*  WBC 16.9* 13.5* 12.2*  PLT 221 211 163    COAGULATION Recent Labs  Lab 07/12/17 1158  INR 0.93    CARDIAC   Recent Labs  Lab 07/12/17  1527 07/12/17 2154  TROPONINI <0.03 <0.03   No results for input(s): PROBNP in the last 168 hours.   CHEMISTRY Recent Labs  Lab 07/13/17 0316 07/14/17 0236 07/15/17 0351 07/16/17 0324 07/17/17 0330 07/18/17 0328 07/19/17 0327  NA 136 141 141 141 144  --   --   K 3.4* 3.9 4.0 3.9 3.9  --   --   CL 101 102 100* 102 103  --   --   CO2 27 31 32 30 31  --   --   GLUCOSE 140* 102* 99 92 92  --   --   BUN 17 17 16 16 16   --   --   CREATININE <0.30* 0.31*  0.32* <0.30* <0.30* <0.30* <0.30* <0.30*  CALCIUM 8.7* 9.0 8.9 9.0 9.1  --   --   MG 1.8 2.0 1.8 1.9 2.0  --   --   PHOS  --  2.4* 2.7 2.9 3.0  --   --    CrCl cannot be calculated (This lab value cannot be used to calculate CrCl because it is not a number:  <0.30).   LIVER Recent Labs  Lab 07/12/17 1158 07/15/17 0351 07/17/17 0330  AST 19 13* 18  ALT 22 13* 13*  ALKPHOS 95 74 69  BILITOT 0.5 1.0 0.5  PROT 7.7 6.7 7.0  ALBUMIN 3.8 3.0* 3.0*  INR 0.93  --   --      INFECTIOUS Recent Labs  Lab 07/12/17 1158 07/12/17 1527  07/13/17 1134 07/14/17 0236 07/15/17 0351  LATICACIDVEN 1.1 1.7  --   --   --   --   PROCALCITON <0.10  --    < > 0.54 0.38 0.18   < > = values in this interval not displayed.     ENDOCRINE CBG (last 3)  No results for input(s): GLUCAP in the last 72 hours.       IMAGING x48h  - image(s) personally visualized  - bibasal infx  Anti-infectives (From admission, onward)   Start     Dose/Rate Route Frequency Ordered Stop   07/12/17 2200  vancomycin (VANCOCIN) IVPB 750 mg/150 ml premix  Status:  Discontinued     750 mg 150 mL/hr over 60 Minutes Intravenous Every 12 hours 07/12/17 1221 07/14/17 1003   07/12/17 2200  ceFEPIme (MAXIPIME) 1 g in dextrose 5 % 50 mL IVPB     1 g 100 mL/hr over 30 Minutes Intravenous Every 8 hours 07/12/17 1221     07/12/17 1300  ceFEPIme (MAXIPIME) 2 g in dextrose 5 % 50 mL IVPB     2 g 100 mL/hr over 30 Minutes Intravenous  Once 07/12/17 1144 07/12/17 1522   07/12/17 1300  vancomycin (VANCOCIN) 2,000 mg in sodium chloride 0.9 % 500 mL IVPB     2,000 mg 250 mL/hr over 120 Minutes Intravenous  Once 07/12/17 1144 07/12/17 1633   07/12/17 1000  doxycycline (VIBRA-TABS) tablet 100 mg  Status:  Discontinued     100 mg Oral Every 12 hours 07/12/17 0946 07/12/17 1229   07/11/17 1800  ceFAZolin (ANCEF) IVPB 2g/100 mL premix     2 g 200 mL/hr over 30 Minutes Intravenous Every 8 hours 07/11/17 1448 07/12/17 0340   07/11/17 0700  ceFAZolin (ANCEF) IVPB 2g/100 mL premix     2 g 200 mL/hr over 30 Minutes Intravenous 30 min pre-op 07/11/17 0700 07/11/17 0915       LINES/TUBES:   DISCUSSION:  ASSESSMENT / PLAN:  PULMONARY A: Acute on chronic respiratory failure due to  ALS with left-sided atelectasis &  pneumonia.   She does have chronic  left hemidiaphragm weakness 1/24 worsening hypoxia  P:   Continue BiPAP qhs , prn daytime, use HFNC daytime Aggressive chest PT, chest vest Incentive spirometry Have asked her husband to check with DME on status of trilogy machine She may also benefit from a cough assist device They have  obtained some equipment from a neighbor that passed from ALS  CARDIOVASCULAR A:  Hypertension P:   hydralazine,labetalol prn Restart PO amlodipin   RENAL / UROLOGICAL A:   Neurogenic bladder status post suprapubic catheter, Botox instillation 1/17 P:   Follow urine output, BMP, replace electrolytes as indicated Suprapubic catheter management per urology    GASTROINTESTINAL A:   SUP Constipation Protein calorie malnutrition,mild P:   Add PPI dys 3 diet Dulcolax prn suppository   INFECTIOUS A:   Left sided infiltrate, possible HCAP - PCT decreasing P:    cefepime x10-14 ds , can stop once clinical improvement    NEUROLOGIC A:   [facility] Depression Anxiety P:   Wellbutrin and Effexor Ativan prn PT following   FAMILY  - Updates:DNR issued Palliative care assisting, hospice eligible  - Inter-disciplinary family meet or Palliative Care meeting due by:  Done  Main issue here is poor secretion clearance, will continue BiPAP nightly and increase efforts at chest PT with vest in the daytime and high flow nasal cannula as tolerated in daytime.  P CCM will be available as needed    Rakesh V. Vassie LollAlva MD  07/19/2017 10:38 AM

## 2017-07-19 NOTE — Progress Notes (Signed)
PROGRESS NOTE  [name] WUJ:[id number] DOB: Nov 24, 1960 DOA: 07/11/2017 PCP: Philemon Kingdom, MD  HPI/Recap of past 24 hours:  Tara Whitehead is a 57 y.o. year old female with medical history significant for ALS, anxiety, depression, HTN, arthritis, s/p knee replacements who presented on 07/11/2017 with SOB and HTN at 1 day postop suprapubic catheter placement and intravesical botox injection requiring placement on BiPAP and prompting transfer to ICU.  She was started empirically on broad-spectrum antibiotics, given empiric Lasix, received chest x-ray which showed possible pleural effusion with elevated left hemidiaphragm.  Has required intermittent use of BiPAP.     This morning,no complaints.  Friend deviated bedside who specializes in transition of care is epic for this.  Patient and family would like to speak with palliative care team.  Assessment/Plan: Active Problems:   Neurogenic bladder disorder   Pressure injury of skin   SOB (shortness of breath)   Acute on chronic respiratory failure with hypoxia (HCC)   HCAP (healthcare-associated pneumonia)   ALS (amyotrophic lateral sclerosis) (HCC)   Goals of care, counseling/discussion   Palliative care encounter  Acute on chronic respiratory failure, multifactorial etiology suspect primary driver progression of ALS; however cannot rule out HCAP in setting of acute leukocytosis and bibasilar infiltrates chest x-ray, stable.  Likely current symptoms exacerbated from recent procedure. No respiratory distress on high flow nasal canula currently but still requiring intermittent BiPAP. PE thought less likely with improvement in sinus tachycardia.  Continue supportive care with aggressive chest PT, Incentive spirometry, encourage out of bed to chair,  PCCM following, empiric cefepime we will continue to monitor and follow blood cultures.  ALS, guarded prognosis.  Continue to have goals of care discussion with health care team and  palliative care consultants.  Her primary concern is not passing for her mother who has end-stage multiple sclerosis.  Previously patient not interested in trach/PEG or tube feeds however she didn't expect her prognosis to worsen this quickly and would prefer not to pass away before her mother.  Patient evaluated to discuss with palliative care potential for transition to home with hospice, as interested in being able to provide devices that will assist with breathing.  I contacted palliative care team in hope of continued conversation today.  Neurogenic bladder status post suprapubic catheter, Botox instillation on 07/11/17. Management per urology, continue to follow urine output and monitor electrolytes on BMP.  Sinus tachycardia, suspect related to dehydration, resolved  Likely worse in the setting of n.p.o. status while awaiting swallow studies.  Has improved with timed IVF. Encouraged patient to continue to stay hydrated  Hypertension, improving. Likely secondary to holding home BP meds in setting of previous NPO status while awaiting swallow study. increased her home amlodipine to 10 mg.  Depression/anxiety.  Home Wellbutrin and Effexor passed modified barium swallow.  Code Status: DNR  Family Communication: Family updated at bedside. Friend also at bedside who works at [employer] with Transitions of care.   Disposition Plan: Monitor respiratory status, once able to tolerate being off BiPAP and stabilizing on nasal cannula, eventually de-escalate IV antibiotics to oral regimen  Consultants: Palliative care, PCCM  Procedures:  TTE on 07/12/17: EF 60-65%.  No regional wall motion normalities.  Grade 1 diastolic dysfunction.  Antimicrobials:  Cefepime 1/18>>>  Doxycycline 1/18  Vancomycin 1/18-1/20   Cultures:  07/12/17: Blood culture x2- growth to date   DVT prophylaxis: SCDs   Objective: Vitals:   07/19/17 0900 07/19/17 1000 07/19/17 1002 07/19/17 1100  BP: (!) 176/84  (!) 183/99 (!) 183/99 135/82  Pulse:      Resp: (!) 21 (!) 26  18  Temp:      TempSrc:      SpO2: 100% 100%  92%  Weight:   130.4 kg (287 lb 7.7 oz)   Height:        Intake/Output Summary (Last 24 hours) at 07/19/2017 1158 Last data filed at 07/19/2017 0800 Gross per 24 hour  Intake 150 ml  Output 325 ml  Net -175 ml   Filed Weights   07/17/17 1900 07/18/17 0600 07/19/17 1002  Weight: 115.8 kg (255 lb 4.7 oz) 115.8 kg (255 lb 4.7 oz) 130.4 kg (287 lb 7.7 oz)    Exam:  General: obese female, lying in bed, no apparent distress Eyes: EOMI Neck: normal, no appreciable JVD Cardiovascular: Tachycardic, no appreciable murmurs, rubs or gallops  Respiratory: On HF nasal cannula,no respiratory distress, decreased effort and breath sounds throughout. No appreciable rhonchi, wheezing, or rales Skin: No Rash Musculoskeletal:No clubbing / cyanosis.  Neurologic: Mental status AAOx3, speech normal, Psychiatric:Appropriate affect, and mood  Data Reviewed: CBC: Recent Labs  Lab 07/13/17 0316 07/14/17 0236 07/15/17 0351 07/16/17 0324 07/17/17 0330  WBC 38.9* 23.7* 16.9* 13.5* 12.2*  NEUTROABS  --  21.2* 14.4* 11.2* 9.3*  HGB 16.3* 15.2* 14.6 15.8* 16.6*  HCT 47.6* 47.1* 45.8 48.1* 52.6*  MCV 87.0 88.0 90.0 89.6 91.3  PLT 255 225 221 211 163   Basic Metabolic Panel: Recent Labs  Lab 07/13/17 0316 07/14/17 0236 07/15/17 0351 07/16/17 0324 07/17/17 0330 07/18/17 0328 07/19/17 0327  NA 136 141 141 141 144  --   --   K 3.4* 3.9 4.0 3.9 3.9  --   --   CL 101 102 100* 102 103  --   --   CO2 27 31 32 30 31  --   --   GLUCOSE 140* 102* 99 92 92  --   --   BUN 17 17 16 16 16   --   --   CREATININE <0.30* 0.31*  0.32* <0.30* <0.30* <0.30* <0.30* <0.30*  CALCIUM 8.7* 9.0 8.9 9.0 9.1  --   --   MG 1.8 2.0 1.8 1.9 2.0  --   --   PHOS  --  2.4* 2.7 2.9 3.0  --   --    GFR: CrCl cannot be calculated (This lab value cannot be used to calculate CrCl because it is not a number:  <0.30). Liver Function Tests: Recent Labs  Lab 07/15/17 0351 07/17/17 0330  AST 13* 18  ALT 13* 13*  ALKPHOS 74 69  BILITOT 1.0 0.5  PROT 6.7 7.0  ALBUMIN 3.0* 3.0*   No results for input(s): LIPASE, AMYLASE in the last 168 hours. No results for input(s): AMMONIA in the last 168 hours. Coagulation Profile: No results for input(s): INR, PROTIME in the last 168 hours. Cardiac Enzymes: Recent Labs  Lab 07/12/17 1527 07/12/17 2154  TROPONINI <0.03 <0.03   BNP (last 3 results) No results for input(s): PROBNP in the last 8760 hours. HbA1C: No results for input(s): HGBA1C in the last 72 hours. CBG: Recent Labs  Lab 07/14/17 0824  GLUCAP 99   Lipid Profile: No results for input(s): CHOL, HDL, LDLCALC, TRIG, CHOLHDL, LDLDIRECT in the last 72 hours. Thyroid Function Tests: No results for input(s): TSH, T4TOTAL, FREET4, T3FREE, THYROIDAB in the last 72 hours. Anemia Panel: No results for input(s): VITAMINB12, FOLATE, FERRITIN, TIBC, IRON, RETICCTPCT in  the last 72 hours. Urine analysis: No results found for: COLORURINE, APPEARANCEUR, LABSPEC, PHURINE, GLUCOSEU, HGBUR, BILIRUBINUR, KETONESUR, PROTEINUR, UROBILINOGEN, NITRITE, LEUKOCYTESUR Sepsis Labs: @LABRCNTIP (procalcitonin:4,lacticidven:4)  ) Recent Results (from the past 240 hour(s))  Culture, blood (routine x 2)     Status: None   Collection Time: 07/12/17 11:55 AM  Result Value Ref Range Status   Specimen Description BLOOD RIGHT ANTECUBITAL  Final   Special Requests   Final    BOTTLES DRAWN AEROBIC AND ANAEROBIC Blood Culture adequate volume   Culture   Final    NO GROWTH 5 DAYS Performed at [facility], [address] [place], [place] [address]    Report Status 07/17/2017 FINAL  Final  Culture, blood (routine x 2)     Status: None   Collection Time: 07/12/17 11:55 AM  Result Value Ref Range Status   Specimen Description BLOOD BLOOD LEFT HAND  Final   Special Requests IN PEDIATRIC BOTTLE Blood Culture  adequate volume  Final   Culture   Final    NO GROWTH 5 DAYS Performed at [facility], [address] [place], [place] [address]    Report Status 07/17/2017 FINAL  Final  MRSA PCR Screening     Status: None   Collection Time: 07/12/17 12:24 PM  Result Value Ref Range Status   MRSA by PCR NEGATIVE NEGATIVE Final    Comment:        The GeneXpert MRSA Assay (FDA approved for NASAL specimens only), is one component of a comprehensive MRSA colonization surveillance program. It is not intended to diagnose MRSA infection nor to guide or monitor treatment for MRSA infections.       Studies: No results found.  Scheduled Meds: . amLODipine  10 mg Oral Daily  . buPROPion  150 mg Oral Daily  . enoxaparin (LOVENOX) injection  60 mg Subcutaneous Q24H  . mouth rinse  15 mL Mouth Rinse BID  . mouth rinse  15 mL Mouth Rinse q12n4p  . nicotine  14 mg Transdermal Daily  . venlafaxine XR  225 mg Oral Q breakfast    Continuous Infusions: . ceFEPime (MAXIPIME) IV Stopped (07/19/17 0701)     LOS: 7 days     Laverna PeaceShayla D Kristal Perl, MD [facility] Pager [phone]  If 7PM-7AM, please contact night-coverage [email or web address] Password [email or web address] 07/19/2017, 11:58 AM

## 2017-07-19 NOTE — Progress Notes (Signed)
SLP Cancellation Note  Patient Details Name: Nyra JabsSharon P Colucci MRN: [id number] DOB: May 02, 1961   Cancelled treatment:       Reason Eval/Treat Not Completed: Medical issues which prohibited therapy - pt currently requiring BiPAP. Will continue efforts.  Celia B. Murvin NatalBueche, [facility], CCC-SLP Speech Language Pathologist [phone]  Leigh AuroraBueche, Celia Brown 07/19/2017, 3:35 PM

## 2017-07-19 NOTE — Progress Notes (Signed)
Daily Progress Note   Patient Name: Tara Whitehead       Date: 07/19/2017 DOB: Jan 17, 1961  Age: 57 y.o. MRN#: [id number] Attending Physician: Laverna PeaceNettey, Shayla D, MD Primary Care Physician: Philemon KingdomProchnau, Caroline, MD Admit Date: 07/11/2017  Reason for Consultation/Follow-up: Establishing goals of care  Subjective: Tara Whitehead is alert, oriented and on high flow O2, currently getting vest therapy, husband at bedside, off BIPAP See below:   Length of Stay: 7  Current Medications: Scheduled Meds:  . amLODipine  10 mg Oral Daily  . buPROPion  150 mg Oral Daily  . enoxaparin (LOVENOX) injection  60 mg Subcutaneous Q24H  . mouth rinse  15 mL Mouth Rinse BID  . mouth rinse  15 mL Mouth Rinse q12n4p  . nicotine  14 mg Transdermal Daily  . venlafaxine XR  225 mg Oral Q breakfast    Continuous Infusions: . ceFEPime (MAXIPIME) IV Stopped (07/19/17 1446)    PRN Meds: acetaminophen **OR** acetaminophen, albuterol, belladonna-opium, clotrimazole, hydrALAZINE, labetalol, LORazepam, ondansetron, triamcinolone cream  Physical Exam         Constitutional: She appears well-developed.   She is sitting up in chair, she has tolerated vest therapy, she is off BIPAP.  HENT:  Head: Normocephalic and atraumatic.  Cardiovascular: regular. Pulmonary/Chest: She has decreased breath sounds.  Abdominal: Soft. Normal appearance.  Neurological: She appears alert, oriented x 3.  Nursing note and vitals reviewed.   Vital Signs: BP 140/67   Pulse 99   Temp 98.3 F (36.8 C) (Oral)   Resp (!) 26   Ht 5\' 11"  (1.803 m)   Wt 130.4 kg (287 lb 7.7 oz)   SpO2 97%   BMI 40.10 kg/m  SpO2: SpO2: 97 % O2 Device: O2 Device: Bi-PAP O2 Flow Rate: O2 Flow Rate (L/min): 15 L/min  Intake/output summary:   Intake/Output  Summary (Last 24 hours) at 07/19/2017 1647 Last data filed at 07/19/2017 1414 Gross per 24 hour  Intake 680 ml  Output 325 ml  Net 355 ml   LBM: Last BM Date: 07/18/17 Baseline Weight: Weight: 117.9 kg (260 lb) Most recent weight: Weight: 130.4 kg (287 lb 7.7 oz)       Palliative Assessment/Data: 20%    Flowsheet Rows     Most Recent Value  Intake Tab  Referral Department  Hospitalist  Unit at Time of Referral  ICU  Palliative Care Primary Diagnosis  Other (Comment) [ALS]  Date Notified  07/15/17  Palliative Care Type  New Palliative care  Reason for referral  Clarify Goals of Care  Date of Admission  07/11/17  Date first seen by Palliative Care  07/16/17  # of days Palliative referral response time  1 Day(s)  # of days IP prior to Palliative referral  4  Clinical Assessment  Psychosocial & Spiritual Assessment  Palliative Care Outcomes      Patient Active Problem List   Diagnosis Date Noted  . ALS (amyotrophic lateral sclerosis) (HCC)   . Goals of care, counseling/discussion   . Palliative care encounter   . HCAP (healthcare-associated pneumonia)   . Pressure injury of skin 07/12/2017  . SOB (shortness of breath)   . Acute on chronic respiratory failure with hypoxia (HCC)   . Neurogenic bladder disorder 07/11/2017    Palliative Care Assessment & Plan   HPI: 57 y.o. female  with past medical history of ALS, anxiety, depression, HTN, arthritis, s/p knee replacements admitted on 07/11/2017 with SOB and HTN at 1 day postop suprapubic catheter placement and intravesical botox injection. She has continued to struggle with dependence on BiPAP and breaks with HFNC 10L, weak cough, unable to have intake. Palliative care requested to assist with GOC conversations regarding desires for trach/PEG if she continues to decline.    Assessment: Long conversation today with Tara Whitehead and her husband at bedside. Tara Whitehead is appreciative of the care she is being given here, she is now off  BIPAP, tolerating ves therapy, wants appropriate hospice services at home.    Emotional support provided.   Recommendations/Plan:  Anxiety: Ativan 0.5-1 mg IV every 4 hours prn.   SOB: Per PCCM. BiPAP PRN, nebs, chest PT.   SLP     Pain/fever/headache: Acetaminophen 650 mg po or suppository every 6 hours prn.  Disposition: home with hospice, long talk with patient, husband, friend Maureen Ralphs to coordinate care: [facility] at [facility], case manager consult requested, patient and family would like to discuss further directly with hospice liaison this weekend: their questions for the hospice agency are as follows:  Do they have experience with ALS signs and symptoms under hospice care?  Do they cover [place] area?  Do they provide assistance with trilogy machine?  Can they provide high O2 if needed?      Goals of Care and Additional Recommendations:  Limitations on Scope of Treatment: Full Scope Treatment, no trach, no PEG  Code Status:  DNR  Prognosis:   Overall prognosis remains poor with progressing ALS. Eligible for hospice with no desire for trach/PEG.   Discharge Planning:  Hopeful to return home. Likely with hospice.  See above   Thank you for allowing the Palliative Medicine Team to assist in the care of this patient.   Total Time 35 min Prolonged Time Billed  no       Greater than 50%  of this time was spent counseling and coordinating care related to the above assessment and plan.  Tara Hawking MD [facility] health palliative medicine team [phone]

## 2017-07-19 NOTE — Progress Notes (Signed)
Physical Therapy Treatment Patient Details Name: Tara Whitehead MRN: [id number] DOB: 1960-12-05 Today's Date: 07/19/2017    History of Present Illness Pt admitted with acute resp failure and hx of ALS    PT Comments    Pt in bed on 15lts nasal groggy/slow to verbally respond.  Spouse "Tim" at bed side.  Vitals taken each position change see below.  Assisted pt to EOB Total Assist + 2.   Increased alertness with activity.  Once upright, pt was able to static sit Supervision level x 8 min.  Attempted sit to stand via "[employer]KE[employer] Hug" however pt to weak to initiate.  Performed lateral scoot using bed pad from elevated bed to drop arm recliner Total Assist + 2.   Positioned in recliner required + @ [place] Assist to shift and scoot hips to center pt 5%.  Pt performed some R LE TE's and was able to sit forward (sit ups) but required hand over hand assist to hold a cup of water to sip without dropping.  Very deconditioned.  Educated on spirometer and flutter to "excersice her lungs"   Spouse asked "how long should she sit up?"   Educated no specific amount of time, the more the better and to call nursing when pt feels she needs to return to bed.    Vitals taken each posiiton change: Supine                  BP 192/80 (119)    HR 103   RR 24    15 lts nasal 97% EOB                      BP 201/78 (124)    HR 81     RR 19     15 lts nasal 96% EOB x 5 min          BP 192/80 (119)    HR 103    RR 24     15 lts nasal 97% Recliner                  BP 158/87 (112)   HR 75      RR 22     15 lts nasal 98%   Follow Up Recommendations  Home health PT(pt and spouse hoping for home however may need high level of care.  TBD closer to D/C date.)     Equipment Recommendations  None recommended by PT    Recommendations for Other Services       Precautions / Restrictions Precautions Precautions: Fall Precaution Comments: ALS with L hemiparsis with L foot drop Restrictions Weight Bearing Restrictions: No Other  Position/Activity Restrictions: uses a power wc for mobility    Mobility  Bed Mobility Overal bed mobility: Needs Assistance Bed Mobility: Supine to Sit     Supine to sit: Total assist;+2 for physical assistance;+2 for safety/equipment(pt 10%)     General bed mobility comments: required increased assist and increased time due to groggy state.  Once upright EOB pt was able to static sit x 8 min at Supervision level.    Transfers Overall transfer level: Needs assistance Equipment used: None Transfers: Lateral/Scoot Transfers           General transfer comment: Attempted sit to stand "Bear Hug" hoever pt too weak to self assist enough to even partially clear hips off bed.  Performed lateral scoot from elevated bed to drop arm recliner using bed pad required +  2 Total Assist pt 10%.   Rec Maxi Sky to transfer back to bed.  Ambulation/Gait             General Gait Details: pt is non amb at prior level but was able to transfer herself to her power [facility]   Stairs            Wheelchair Mobility    Modified Rankin (Stroke Patients Only)       Balance                                            Cognition Arousal/Alertness: Awake/alert Behavior During Therapy: WFL for tasks assessed/performed Overall Cognitive Status: Impaired/Different from baseline                                 General Comments: difficult to arouse initially    increased alterness with increased activity    AxO x 4 at end of session just slow/groggy      Exercises      General Comments        Pertinent Vitals/Pain Pain Assessment: No/denies pain    Home Living                      Prior Function            PT Goals (current goals can now be found in the care plan section) Progress towards PT goals: Progressing toward goals    Frequency    Min 3X/week      PT Plan Current plan remains appropriate    Co-evaluation               AM-PAC PT "6 Clicks" Daily Activity  Outcome Measure  Difficulty turning over in bed (including adjusting bedclothes, sheets and blankets)?: Unable Difficulty moving from lying on back to sitting on the side of the bed? : Unable Difficulty sitting down on and standing up from a chair with arms (e.g., wheelchair, bedside commode, etc,.)?: Unable Help needed moving to and from a bed to chair (including a wheelchair)?: Total Help needed walking in hospital room?: Total Help needed climbing 3-5 steps with a railing? : Total 6 Click Score: 6    End of Session Equipment Utilized During Treatment: Gait belt Activity Tolerance: Patient limited by fatigue Patient left: in chair;with call bell/phone within reach;with family/visitor present Nurse Communication: Need for lift equipment PT Visit Diagnosis: Muscle weakness (generalized) (M62.81);Difficulty in walking, not elsewhere classified (R26.2)     Time: 1030-1100 PT Time Calculation (min) (ACUTE ONLY): 30 min  Charges:  $Therapeutic Activity: 23-37 mins                    G Codes:       Felecia Shelling  PTA [facility]  Acute  Rehab Pager      [phone]

## 2017-07-20 NOTE — Progress Notes (Signed)
PROGRESS NOTE  Tara Whitehead ZOX:[id number] DOB: [birth year] DOA: 07/11/2017 PCP: Philemon Kingdom, MD  HPI/Recap of past 24 hours:  Tara Whitehead is a 57 y.o. year old female with medical history significant for ALS, anxiety, depression, HTN, arthritis, s/p knee replacements who presented on 07/11/2017 with SOB and HTN at 1 day postop suprapubic catheter placement and intravesical botox injection requiring placement on BiPAP and prompting transfer to ICU.  She was started empirically on broad-spectrum antibiotics, given empiric Lasix, received chest x-ray which showed possible pleural effusion with elevated left hemidiaphragm.  Has required intermittent use of BiPAP.     This morning,no complaints.  Sitting in room with husband and brother. Eating ok. Doing well on HFNC  Assessment/Plan: Active Problems:   Neurogenic bladder disorder   Pressure injury of skin   SOB (shortness of breath)   Acute on chronic respiratory failure with hypoxia (HCC)   HCAP (healthcare-associated pneumonia)   ALS (amyotrophic lateral sclerosis) (HCC)   Goals of care, counseling/discussion   Palliative care encounter  Acute on chronic respiratory failure, multifactorial etiology suspect primary driver progression of ALS; however cannot rule out HCAP in setting of acute leukocytosis and bibasilar infiltrates chest x-ray, stable.  Likely current symptoms exacerbated from recent procedure. No respiratory distress on high flow nasal canula currently but still requiring intermittent BiPAP.   Continue supportive care with aggressive chest PT, Incentive spirometry, encourage out of bed to chair,  PCCM following, empiric cefepime we will continue to monitor and follow blood cultures. Plan to go home with hospice awaiting hospice liaison for family to discuss with ( [facility])  ALS, guarded prognosis.  Continue to have goals of care discussion with health care team and palliative care consultants. As  mentioned above would like to go home with hospice. Has questions for liaison regarding experience with ALS pts, coverage in [place], high flow O2 and trilogy device  Appreciate Palliative care team assisting with these conversations  Neurogenic bladder status post suprapubic catheter, Botox instillation on 07/11/17. Management per urology, continue to follow urine output and monitor electrolytes on BMP.  Sinus tachycardia, suspect related to dehydration, resolved  Likely worse in the setting of n.p.o. status while awaiting swallow studies.  Has improved with timed IVF. Encouraged patient to continue to stay hydrated  Hypertension, improving. Likely secondary to holding home BP meds in setting of previous NPO status while awaiting swallow study. On amlodipine to 10 mg.  Depression/anxiety.  Home Wellbutrin and Effexor passed modified barium swallow.  Code Status: DNR  Family Communication: Family updated at bedside.    Disposition Plan: Awaiting hospice liaison, pt has questions as mentioned above would like to ensure appropriate equipment can be provided for breathing if going home with hospice  Consultants: Palliative care, PCCM  Procedures:  TTE on 07/12/17: EF 60-65%.  No regional wall motion normalities.  Grade 1 diastolic dysfunction.  Antimicrobials:  Cefepime 1/18>>>  Doxycycline 1/18  Vancomycin 1/18-1/20   Cultures:  07/12/17: Blood culture x2- growth to date   DVT prophylaxis: SCDs   Objective: Vitals:   07/20/17 1300 07/20/17 1400 07/20/17 1500 07/20/17 1600  BP: (!) 154/86 (!) 149/79 (!) 158/69 (!) 157/73  Pulse:      Resp: 17 (!) 23 18 (!) 25  Temp:      TempSrc:      SpO2: 96% 93% 90% 92%  Weight:      Height:        Intake/Output Summary (Last 24  hours) at 07/20/2017 1629 Last data filed at 07/20/2017 1518 Gross per 24 hour  Intake 1210 ml  Output 900 ml  Net 310 ml   Filed Weights   07/17/17 1900 07/18/17 0600 07/19/17 1002  Weight: 115.8  kg (255 lb 4.7 oz) 115.8 kg (255 lb 4.7 oz) 130.4 kg (287 lb 7.7 oz)    Exam:  General: obese female, lying in bed, no apparent distress Cardiovascular: Tachycardic, no appreciable murmurs, rubs or gallops  Respiratory: On HF nasal cannula,no respiratory distress, decreased effort and breath sounds throughout. No appreciable rhonchi, wheezing, or rales Skin: No Rash Musculoskeletal:No clubbing / cyanosis.  Neurologic: Mental status AAOx3, speech normal, Psychiatric:Appropriate affect, and mood  Data Reviewed: CBC: Recent Labs  Lab 07/14/17 0236 07/15/17 0351 07/16/17 0324 07/17/17 0330  WBC 23.7* 16.9* 13.5* 12.2*  NEUTROABS 21.2* 14.4* 11.2* 9.3*  HGB 15.2* 14.6 15.8* 16.6*  HCT 47.1* 45.8 48.1* 52.6*  MCV 88.0 90.0 89.6 91.3  PLT 225 221 211 163   Basic Metabolic Panel: Recent Labs  Lab 07/14/17 0236 07/15/17 0351 07/16/17 0324 07/17/17 0330 07/18/17 0328 07/19/17 0327  NA 141 141 141 144  --   --   K 3.9 4.0 3.9 3.9  --   --   CL 102 100* 102 103  --   --   CO2 31 32 30 31  --   --   GLUCOSE 102* 99 92 92  --   --   BUN 17 16 16 16   --   --   CREATININE 0.31*  0.32* <0.30* <0.30* <0.30* <0.30* <0.30*  CALCIUM 9.0 8.9 9.0 9.1  --   --   MG 2.0 1.8 1.9 2.0  --   --   PHOS 2.4* 2.7 2.9 3.0  --   --    GFR: CrCl cannot be calculated (This lab value cannot be used to calculate CrCl because it is not a number: <0.30). Liver Function Tests: Recent Labs  Lab 07/15/17 0351 07/17/17 0330  AST 13* 18  ALT 13* 13*  ALKPHOS 74 69  BILITOT 1.0 0.5  PROT 6.7 7.0  ALBUMIN 3.0* 3.0*   No results for input(s): LIPASE, AMYLASE in the last 168 hours. No results for input(s): AMMONIA in the last 168 hours. Coagulation Profile: No results for input(s): INR, PROTIME in the last 168 hours. Cardiac Enzymes: No results for input(s): CKTOTAL, CKMB, CKMBINDEX, TROPONINI in the last 168 hours. BNP (last 3 results) No results for input(s): PROBNP in the last 8760  hours. HbA1C: No results for input(s): HGBA1C in the last 72 hours. CBG: Recent Labs  Lab 07/14/17 0824  GLUCAP 99   Lipid Profile: No results for input(s): CHOL, HDL, LDLCALC, TRIG, CHOLHDL, LDLDIRECT in the last 72 hours. Thyroid Function Tests: No results for input(s): TSH, T4TOTAL, FREET4, T3FREE, THYROIDAB in the last 72 hours. Anemia Panel: No results for input(s): VITAMINB12, FOLATE, FERRITIN, TIBC, IRON, RETICCTPCT in the last 72 hours. Urine analysis: No results found for: COLORURINE, APPEARANCEUR, LABSPEC, PHURINE, GLUCOSEU, HGBUR, BILIRUBINUR, KETONESUR, PROTEINUR, UROBILINOGEN, NITRITE, LEUKOCYTESUR Sepsis Labs: @LABRCNTIP (procalcitonin:4,lacticidven:4)  ) Recent Results (from the past 240 hour(s))  Culture, blood (routine x 2)     Status: None   Collection Time: 07/12/17 11:55 AM  Result Value Ref Range Status   Specimen Description BLOOD RIGHT ANTECUBITAL  Final   Special Requests   Final    BOTTLES DRAWN AEROBIC AND ANAEROBIC Blood Culture adequate volume   Culture   Final  NO GROWTH 5 DAYS Performed at [facility], [address] [place], [place] [address]    Report Status 07/17/2017 FINAL  Final  Culture, blood (routine x 2)     Status: None   Collection Time: 07/12/17 11:55 AM  Result Value Ref Range Status   Specimen Description BLOOD BLOOD LEFT HAND  Final   Special Requests IN PEDIATRIC BOTTLE Blood Culture adequate volume  Final   Culture   Final    NO GROWTH 5 DAYS Performed at [facility], [address] [place], [place] [address]    Report Status 07/17/2017 FINAL  Final  MRSA PCR Screening     Status: None   Collection Time: 07/12/17 12:24 PM  Result Value Ref Range Status   MRSA by PCR NEGATIVE NEGATIVE Final    Comment:        The GeneXpert MRSA Assay (FDA approved for NASAL specimens only), is one component of a comprehensive MRSA colonization surveillance program. It is not intended to diagnose MRSA infection nor to  guide or monitor treatment for MRSA infections.       Studies: No results found.  Scheduled Meds: . amLODipine  10 mg Oral Daily  . buPROPion  150 mg Oral Daily  . enoxaparin (LOVENOX) injection  60 mg Subcutaneous Q24H  . mouth rinse  15 mL Mouth Rinse BID  . mouth rinse  15 mL Mouth Rinse q12n4p  . nicotine  14 mg Transdermal Daily  . venlafaxine XR  225 mg Oral Q breakfast    Continuous Infusions: . ceFEPime (MAXIPIME) IV Stopped (07/20/17 1518)     LOS: 8 days     Laverna PeaceShayla D Arlester Keehan, MD [facility] Pager [phone]  If 7PM-7AM, please contact night-coverage [email or web address] Password [email or web address] 07/20/2017, 4:29 PM

## 2017-07-20 NOTE — Progress Notes (Signed)
Patient did not want to wear BIPAP tonight. She wanted to try to wear oxygen via partial rebreather.for comfort. Patient stated she was uncomfortable wear the Bipap mask last night. RT will continue to monitor.

## 2017-07-21 MED ORDER — NYSTATIN 100000 UNIT/GM EX POWD
Freq: Three times a day (TID) | CUTANEOUS | Status: DC
  Administered 2017-07-21 – 2017-07-22 (×4): via TOPICAL
  Administered 2017-07-23: 1 g via TOPICAL
  Filled 2017-07-21: qty 15

## 2017-07-21 NOTE — Progress Notes (Signed)
Pt appears to be voiding via her urethra as well as her suprapubic catheter.  Informed Dr. Alvester MorinBell.   Erick Blinksuchman, Donivan Thammavong D, RN

## 2017-07-21 NOTE — Progress Notes (Addendum)
PROGRESS NOTE  Tara JabsSharon P Fore HQI:[id number] DOB: 1960/12/16 DOA: 07/11/2017 PCP: Philemon KingdomProchnau, Caroline, MD  HPI/Recap of past 24 hours:  Tara Whitehead is a 57 y.o. year old female with medical history significant for ALS, anxiety, depression, HTN, arthritis, s/p knee replacements who presented on 07/11/2017 with SOB and HTN at 1 day postop suprapubic catheter placement and intravesical botox injection requiring placement on BiPAP and prompting transfer to ICU.  She was started empirically on broad-spectrum antibiotics, given empiric Lasix, received chest x-ray which showed possible pleural effusion with elevated left hemidiaphragm.  Has required intermittent use of BiPAP.     This morning,no complaints.  Does not remember last bowel movement.  Did non-rebreather mask overnight instead of BiPAP.  Assessment/Plan: Active Problems:   Neurogenic bladder disorder   Pressure injury of skin   SOB (shortness of breath)   Acute on chronic respiratory failure with hypoxia (HCC)   HCAP (healthcare-associated pneumonia)   ALS (amyotrophic lateral sclerosis) (HCC)   Goals of care, counseling/discussion   Palliative care encounter  Acute on chronic respiratory failure, multifactorial etiology suspect primary driver progression of ALS; however cannot rule out HCAP in setting of acute leukocytosis and bibasilar infiltrates chest x-ray, stable.  Likely current symptoms exacerbated from recent procedure. No respiratory distress on high flow nasal canula currently but still requiring intermittent BiPAP.   Continue supportive care with aggressive chest PT, Incentive spirometry, encourage out of bed to chair, will complete last dose of cefepime today.  ALS, guarded prognosis.  Patient wants to go home with hospice.  Requested hospice edema.  Social worker consulted.  Plan for hospice liaison to speak patient today.    Neurogenic bladder status post suprapubic catheter, Botox instillation on  07/11/17. Management per urology, continue to follow urine output and monitor electrolytes on BMP.  Sinus tachycardia, suspect related to dehydration, resolved  Likely worse in the setting of n.p.o. status while awaiting swallow studies.  Has improved with timed IVF. Encouraged patient to continue to stay hydrated  Hypertension, improving. Likely secondary to holding home BP meds in setting of previous NPO status while awaiting swallow study. On amlodipine to 10 mg.  Depression/anxiety.  Home Wellbutrin and Effexor passed modified barium swallow.  Stage 1 pressure injury of sacrum, present on admission  Code Status: DNR  Family Communication: no family at bedside.    Disposition Plan: Awaiting hospice liaison, pt has questions as mentioned above would like to ensure appropriate equipment can be provided for breathing if going home with hospice, likely home next 24 hours with hospice  Consultants: Palliative care, PCCM  Procedures:  TTE on 07/12/17: EF 60-65%.  No regional wall motion normalities.  Grade 1 diastolic dysfunction.  Antimicrobials:  Cefepime 1/18>>>  Doxycycline 1/18  Vancomycin 1/18-1/20   Cultures:  07/12/17: Blood culture x2- growth to date   DVT prophylaxis: SCDs   Objective: Vitals:   07/21/17 0800 07/21/17 0900 07/21/17 1000 07/21/17 1100  BP: (!) 168/65 140/60 138/71 (!) 141/72  Pulse:      Resp: (!) 24 20 (!) 23 (!) 30  Temp: (!) 97.5 F (36.4 C)     TempSrc: Oral     SpO2: 95% 90% 99% 96%  Weight:      Height:        Intake/Output Summary (Last 24 hours) at 07/21/2017 1503 Last data filed at 07/21/2017 0600 Gross per 24 hour  Intake 210 ml  Output 1100 ml  Net -890 ml   [employer] Power[employer]iled Weights  07/17/17 1900 07/18/17 0600 07/19/17 1002  Weight: 115.8 kg (255 lb 4.7 oz) 115.8 kg (255 lb 4.7 oz) 130.4 kg (287 lb 7.7 oz)    Exam:  General: obese female, lying in bed, no apparent distress Cardiovascular: Tachycardic, no appreciable murmurs,  rubs or gallops  Respiratory: On HF nasal cannula,no respiratory distress, decreased effort and breath sounds throughout. No appreciable rhonchi, wheezing, or rales Skin: No Rash Musculoskeletal:No clubbing / cyanosis.  Neurologic: Mental status AAOx3, speech normal, Psychiatric:Appropriate affect, and mood  Data Reviewed: CBC: Recent Labs  Lab 07/15/17 0351 07/16/17 0324 07/17/17 0330  WBC 16.9* 13.5* 12.2*  NEUTROABS 14.4* 11.2* 9.3*  HGB 14.6 15.8* 16.6*  HCT 45.8 48.1* 52.6*  MCV 90.0 89.6 91.3  PLT 221 211 163   Basic Metabolic Panel: Recent Labs  Lab 07/15/17 0351 07/16/17 0324 07/17/17 0330 07/18/17 0328 07/19/17 0327  NA 141 141 144  --   --   K 4.0 3.9 3.9  --   --   CL 100* 102 103  --   --   CO2 32 30 31  --   --   GLUCOSE 99 92 92  --   --   BUN 16 16 16   --   --   CREATININE <0.30* <0.30* <0.30* <0.30* <0.30*  CALCIUM 8.9 9.0 9.1  --   --   MG 1.8 1.9 2.0  --   --   PHOS 2.7 2.9 3.0  --   --    GFR: CrCl cannot be calculated (This lab value cannot be used to calculate CrCl because it is not a number: <0.30). Liver Function Tests: Recent Labs  Lab 07/15/17 0351 07/17/17 0330  AST 13* 18  ALT 13* 13*  ALKPHOS 74 69  BILITOT 1.0 0.5  PROT 6.7 7.0  ALBUMIN 3.0* 3.0*   No results for input(s): LIPASE, AMYLASE in the last 168 hours. No results for input(s): AMMONIA in the last 168 hours. Coagulation Profile: No results for input(s): INR, PROTIME in the last 168 hours. Cardiac Enzymes: No results for input(s): CKTOTAL, CKMB, CKMBINDEX, TROPONINI in the last 168 hours. BNP (last 3 results) No results for input(s): PROBNP in the last 8760 hours. HbA1C: No results for input(s): HGBA1C in the last 72 hours. CBG: No results for input(s): GLUCAP in the last 168 hours. Lipid Profile: No results for input(s): CHOL, HDL, LDLCALC, TRIG, CHOLHDL, LDLDIRECT in the last 72 hours. Thyroid Function Tests: No results for input(s): TSH, T4TOTAL, FREET4,  T3FREE, THYROIDAB in the last 72 hours. Anemia Panel: No results for input(s): VITAMINB12, FOLATE, FERRITIN, TIBC, IRON, RETICCTPCT in the last 72 hours. Urine analysis: No results found for: COLORURINE, APPEARANCEUR, LABSPEC, PHURINE, GLUCOSEU, HGBUR, BILIRUBINUR, KETONESUR, PROTEINUR, UROBILINOGEN, NITRITE, LEUKOCYTESUR Sepsis Labs: @LABRCNTIP (procalcitonin:4,lacticidven:4)  ) Recent Results (from the past 240 hour(s))  Culture, blood (routine x 2)     Status: None   Collection Time: 07/12/17 11:55 AM  Result Value Ref Range Status   Specimen Description BLOOD RIGHT ANTECUBITAL  Final   Special Requests   Final    BOTTLES DRAWN AEROBIC AND ANAEROBIC Blood Culture adequate volume   Culture   Final    NO GROWTH 5 DAYS Performed at [facility], [address], [place], [place] [address]    Report Status 07/17/2017 FINAL  Final  Culture, blood (routine x 2)     Status: None   Collection Time: 07/12/17 11:55 AM  Result Value Ref Range Status   Specimen Description  BLOOD BLOOD LEFT HAND  Final   Special Requests IN PEDIATRIC BOTTLE Blood Culture adequate volume  Final   Culture   Final    NO GROWTH 5 DAYS Performed at [facility], [address], [place], [place] [address]    Report Status 07/17/2017 FINAL  Final  MRSA PCR Screening     Status: None   Collection Time: 07/12/17 12:24 PM  Result Value Ref Range Status   MRSA by PCR NEGATIVE NEGATIVE Final    Comment:        The GeneXpert MRSA Assay (FDA approved for NASAL specimens only), is one component of a comprehensive MRSA colonization surveillance program. It is not intended to diagnose MRSA infection nor to guide or monitor treatment for MRSA infections.       Studies: No results found.  Scheduled Meds: . amLODipine  10 mg Oral Daily  . buPROPion  150 mg Oral Daily  . enoxaparin (LOVENOX) injection  60 mg Subcutaneous Q24H  . mouth rinse  15 mL Mouth Rinse BID  . mouth rinse  15 mL Mouth Rinse  q12n4p  . nicotine  14 mg Transdermal Daily  . nystatin   Topical TID  . venlafaxine XR  225 mg Oral Q breakfast    Continuous Infusions: . ceFEPime (MAXIPIME) IV 1 g (07/21/17 6045)     LOS: 9 days     Laverna Peace, MD [facility] Pager [phone]  If 7PM-7AM, please contact night-coverage [email or web address] Password [facility] 07/21/2017, 3:03 PM

## 2017-07-21 NOTE — Care Management Note (Signed)
Case Management Note  Patient Details  Name: Tara Whitehead MRN: [id number] Date of Birth: [birth year]  Subjective/Objective:     ALS               Action/Plan: Spoke to offered choice for Home Hospice. [facility]ed [facility]. Spoke to Yemen[place]iana and she will see pt today.   Expected Discharge Date:                  Expected Discharge Plan:  Home w Hospice Care  In-House Referral:  NA  Discharge planning Services  CM Consult  Post Acute Care Choice:  Hospice Choice offered to:  Patient, Spouse  DME Arranged:    DME Agency:     HH Arranged:  RN HH Agency[facility]  [facility]  Status of Service:  In process, will continue to follow  If discussed at Long Length of Stay Meetings, dates discussed:    Additional Comments:  Elliot CousinShavis, Idalee [place] Ellen, RN 07/21/2017, 12:46 PM

## 2017-07-21 NOTE — Progress Notes (Signed)
Patient would like to wear oxygen via partial rebreather instead of BIPAP

## 2017-07-21 NOTE — Progress Notes (Signed)
Daily Progress Note   Patient Name: Tara Whitehead       Date: 07/21/2017 DOB: 1960/12/02  Age: 57 y.o. MRN#: [id number] Attending Physician: Desiree Hane, MD Primary Care Physician: Ernestene Kiel, MD Admit Date: 07/11/2017  Reason for Consultation/Follow-up: Establishing goals of care  Subjective: Tara Whitehead is sitting up in bed. Appears to have a little more energy. Has a biscuit with sausage gravy in front of her (she is happy with this and being able to eat and drink again).    Length of Stay: 9  Current Medications: Scheduled Meds:  . amLODipine  10 mg Oral Daily  . buPROPion  150 mg Oral Daily  . enoxaparin (LOVENOX) injection  60 mg Subcutaneous Q24H  . mouth rinse  15 mL Mouth Rinse BID  . mouth rinse  15 mL Mouth Rinse q12n4p  . nicotine  14 mg Transdermal Daily  . nystatin   Topical TID  . venlafaxine XR  225 mg Oral Q breakfast    Continuous Infusions: . ceFEPime (MAXIPIME) IV 1 g (07/21/17 0619)    PRN Meds: acetaminophen **OR** acetaminophen, albuterol, belladonna-opium, clotrimazole, hydrALAZINE, labetalol, LORazepam, ondansetron, triamcinolone cream  Physical Exam         Constitutional: She appears well-developed. She has a sickly appearance. She appears ill.  HENT:  Head: Normocephalic and atraumatic.  Cardiovascular: Tachycardia present.  Pulmonary/Chest: She has decreased breath sounds.  Abdominal: Soft. Normal appearance.  Neurological: She appears alert, oriented x 3.  Nursing note and vitals reviewed.   Vital Signs: BP (!) 141/72   Pulse 91   Temp (!) 97.5 F (36.4 C) (Oral)   Resp (!) 30   Ht '5\' 11"'$  (1.803 m)   Wt 130.4 kg (287 lb 7.7 oz)   SpO2 96%   BMI 40.10 kg/m  SpO2: SpO2: 96 % O2 Device: O2 Device: High Flow Nasal Cannula O2  Flow Rate: O2 Flow Rate (L/min): 15 L/min  Intake/output summary:   Intake/Output Summary (Last 24 hours) at 07/21/2017 1134 Last data filed at 07/21/2017 0600 Gross per 24 hour  Intake 570 ml  Output 1100 ml  Net -530 ml   LBM: Last BM Date: 07/18/17 Baseline Weight: Weight: 117.9 kg (260 lb) Most recent weight: Weight: 130.4 kg (287 lb 7.7 oz)  Palliative Assessment/Data: 20%    Flowsheet Rows     Most Recent Value  Intake Tab  Referral Department  Hospitalist  Unit at Time of Referral  ICU  Palliative Care Primary Diagnosis  Other (Comment) [ALS]  Date Notified  07/15/17  Palliative Care Type  New Palliative care  Reason for referral  Clarify Goals of Care  Date of Admission  07/11/17  Date first seen by Palliative Care  07/16/17  # of days Palliative referral response time  1 Day(s)  # of days IP prior to Palliative referral  4  Clinical Assessment  Psychosocial & Spiritual Assessment  Palliative Care Outcomes      Patient Active Problem List   Diagnosis Date Noted  . ALS (amyotrophic lateral sclerosis) ([place])   . Goals of care, counseling/discussion   . Palliative care encounter   . HCAP (healthcare-associated pneumonia)   . Pressure injury of skin 07/12/2017  . SOB (shortness of breath)   . Acute on chronic respiratory failure with hypoxia ([place])   . Neurogenic bladder disorder 07/11/2017    Palliative Care Assessment & Plan   HPI: 57 y.o. female  with past medical history of ALS, anxiety, depression, HTN, arthritis, s/p knee replacements admitted on 07/11/2017 with SOB and HTN at 1 day postop suprapubic catheter placement and intravesical botox injection. She has continued to struggle with dependence on BiPAP and breaks with HFNC 10L, weak cough, unable to have intake. Palliative care requested to assist with [facility] conversations regarding desires for trach/PEG if she continues to decline.    Assessment: I met with Tara Whitehead, husband [name], Anayiah's brother  and his wife at bedside. They are awaiting to speak with hospice and are hoping that hospice can speak with them today as she is hoping to include her friend, Adonis Huguenin, and her brother as he goes back to [place] later today. I called and spoke with [facility] and with [facility] who are trying to arrange a meeting with Krystyl and her family. Appreciate everyone helping Iyesha. Adonis Huguenin had some questions for hospice largely about equipment and experience with ALS patients.   Also Tim asked about getting her out of the house to visit her mother when they get home. I explained that this depends on Sherra and what she is up too and might take some planning depending on her level of oxygen needs at the time. Hospice should be able to assist them in a plan. They all seem at peace with the plan.   Recommendations/Plan:  Anxiety: Ativan 0.5-1 mg IV every 4 hours prn.   SOB: Per PCCM. BiPAP, nebs, chest PT.   SLP following for window to further assess swallow safety.   Pain/fever/headache: Acetaminophen 650 mg po or suppository every 6 hours prn.    Goals of Care and Additional Recommendations:  Limitations on Scope of Treatment: Full Scope Treatment, no trach, no PEG  Code Status:  DNR  Prognosis:   Overall prognosis remains poor with progressing ALS. Eligible for hospice with no desire for trach/PEG.   Discharge Planning:  Hopeful to return home. Likely with hospice.    Thank you for allowing the Palliative Medicine Team to assist in the care of this patient.   Total Time 25  min Prolonged Time Billed  no       Greater than 50%  of this time was spent counseling and coordinating care related to the above assessment and plan.  Vinie Sill, NP Palliative Medicine Team Pager # [phone] (  M-F 8a-5p) Team Phone # [phone] (Nights/Weekends)

## 2017-07-22 DIAGNOSIS — L89151 Pressure ulcer of sacral region, stage 1: Secondary | ICD-10-CM

## 2017-07-22 DIAGNOSIS — R0902 Hypoxemia: Secondary | ICD-10-CM

## 2017-07-22 MED ORDER — BOOST / RESOURCE BREEZE PO LIQD CUSTOM: 1.0000 | Freq: Two times a day (BID) | ORAL | Status: DC | PRN

## 2017-07-22 MED ORDER — ENSURE ENLIVE PO LIQD: 237.0000 mL | Freq: Every day | ORAL | Status: DC | PRN

## 2017-07-22 NOTE — Progress Notes (Signed)
Palliative:  I met today with Tara Whitehead, husband [name], and her aunt [name] at bedside. [facility] has spoken with patient and family and arranging home equipment and care. [name] is hopeful to return home tomorrow. Main concern is seeing urology to evaluate for urine coming from urethra instead of suprapubic. Friend and nurse Adonis Huguenin also on phone with [name] and has no further questions or concerns as well. Emotional support provided.   15 min  Vinie Sill, NP Palliative Medicine Team Pager # [phone] (M-F 8a-5p) Team Phone # [phone] (Nights/Weekends)

## 2017-07-22 NOTE — Progress Notes (Signed)
PROGRESS NOTE  Tara Whitehead:[id number] DOB: 04-Mar-1961 DOA: 07/11/2017 PCP: Ernestene Kiel, MD  HPI/Recap of past 24 hours:  Tara Whitehead is a 57 y.o. year old female with medical history significant for ALS, anxiety, depression, HTN, arthritis, s/p knee replacements who presented on 07/11/2017 with SOB and HTN at 1 day postop suprapubic catheter placement and intravesical botox injection requiring placement on BiPAP and prompting transfer to ICU.  She was started empirically on broad-spectrum antibiotics, given empiric Lasix, received chest x-ray which showed possible pleural effusion with elevated left hemidiaphragm.  Has required intermittent use of BiPAP.     This morning,no complaints.  Glad to have family support in this difficult time  Assessment/Plan: Active Problems:   Neurogenic bladder disorder   Pressure injury of skin   SOB (shortness of breath)   Acute on chronic respiratory failure with hypoxia (H[facility])   HCAP (healthcare-associated pneumonia)   ALS (amyotrophic lateral sclerosis) (H[facility])   Goals of care, counseling/discussion   Palliative care encounter  Acute on chronic respiratory failure, multifactorial etiology suspect primary driver progression of ALS; however cannot rule out HCAP in setting of acute leukocytosis and bibasilar infiltrates chest x-ray, stable.  Likely current symptoms exacerbated from recent procedure. No respiratory distress on high flow nasal canula currently but still requiring intermittent BiPAP.   Complete cefepime today. [facility] liaison met with patient and family. Currently arranging respiratory equipment(Bipap, therapy vest, O2, yanker suction) to have at home to allow safe discharge hopefully on 1/29.   ALS, guarded prognosis.  Patient going home with hospice as mentioned above.Anticipate dc on 1/29   Neurogenic bladder status post suprapubic catheter, Botox instillation on 07/11/17. Some concern of suprapubic catheter not  working. Awaiting urology evaluation.   Sinus tachycardia, suspect related to dehydration, resolved  Likely worse in the setting of n.p.o. status while awaiting swallow studies.  Has improved with timed IVF. Encouraged patient to continue to stay hydrated  Hypertension, improving. Likely secondary to holding home BP meds in setting of previous NPO status while awaiting swallow study. On amlodipine t10 mg.  Depression/anxiety.  Home Wellbutrin and Effexor passed modified barium swallow.  Stage 1 pressure injury of sacrum, present on admission  Code Status: DNR  Family Communication: no family at bedside.    Disposition Plan: Awaiting safe discharge plan ( respiratory equipment at home via [facility]), urology evaluation of suprapubic catheter. Anticipate home with hospice on 1/29 hours with hospice  Consultants: Palliative care, PCCM  Procedures:  TTE on 07/12/17: EF 60-65%.  No regional wall motion normalities.  Grade 1 diastolic dysfunction.  Antimicrobials:  Cefepime 1/18>>>1/28  Doxycycline 1/18  Vancomycin 1/18-1/20   Cultures:  07/12/17: Blood culture x2- growth to date   DVT prophylaxis: SCDs   Objective: Vitals:   07/22/17 0500 07/22/17 0600 07/22/17 0800 07/22/17 1039  BP: (!) 139/55 (!) 134/51  (!) 158/79  Pulse:      Resp: (!) 24 (!) 22    Temp:   98.2 F (36.8 C)   TempSrc:   Axillary   SpO2:  93%    Weight:      Height:        Intake/Output Summary (Last 24 hours) at 07/22/2017 1256 Last data filed at 07/22/2017 9562 Gross per 24 hour  Intake -  Output 350 ml  Net -350 ml   Filed Weights   07/17/17 1900 07/18/17 0600 07/19/17 1002  Weight: 115.8 kg (255 lb 4.7 oz) 115.8 kg (255  lb 4.7 oz) 130.4 kg (287 lb 7.7 oz)    Exam:  General: obese female, lying in bed, no apparent distress Cardiovascular: RRR, no appreciable murmurs, rubs or gallops  Respiratory: On HF nasal cannula,no respiratory distress, decreased effort and breath  sounds throughout. No appreciable rhonchi, wheezing, or rales Skin: No Rash Neurologic: Mental status AAOx3, speech normal, Psychiatric:Appropriate affect, and mood  Data Reviewed: CBC: Recent Labs  Lab 07/16/17 0324 07/17/17 0330  WBC 13.5* 12.2*  NEUTROABS 11.2* 9.3*  HGB 15.8* 16.6*  HCT 48.1* 52.6*  MCV 89.6 91.3  PLT 211 935   Basic Metabolic Panel: Recent Labs  Lab 07/16/17 0324 07/17/17 0330 07/18/17 0328 07/19/17 0327  NA 141 144  --   --   K 3.9 3.9  --   --   CL 102 103  --   --   CO2 30 31  --   --   GLUCOSE 92 92  --   --   BUN 16 16  --   --   CREATININE <0.30* <0.30* <0.30* <0.30*  CALCIUM 9.0 9.1  --   --   MG 1.9 2.0  --   --   PHOS 2.9 3.0  --   --    GFR: CrCl cannot be calculated (This lab value cannot be used to calculate CrCl because it is not a number: <0.30). Liver Function Tests: Recent Labs  Lab 07/17/17 0330  AST 18  ALT 13*  ALKPHOS 69  BILITOT 0.5  PROT 7.0  ALBUMIN 3.0*   No results for input(s): LIPASE, AMYLASE in the last 168 hours. No results for input(s): AMMONIA in the last 168 hours. Coagulation Profile: No results for input(s): INR, PROTIME in the last 168 hours. Cardiac Enzymes: No results for input(s): CKTOTAL, CKMB, CKMBINDEX, TROPONINI in the last 168 hours. BNP (last 3 results) No results for input(s): PROBNP in the last 8760 hours. HbA1C: No results for input(s): HGBA1C in the last 72 hours. CBG: No results for input(s): GLUCAP in the last 168 hours. Lipid Profile: No results for input(s): CHOL, HDL, LDLCALC, TRIG, CHOLHDL, LDLDIRECT in the last 72 hours. Thyroid Function Tests: No results for input(s): TSH, T4TOTAL, FREET4, T3FREE, THYROIDAB in the last 72 hours. Anemia Panel: No results for input(s): VITAMINB12, FOLATE, FERRITIN, TIBC, IRON, RETICCTPCT in the last 72 hours. Urine analysis: No results found for: COLORURINE, APPEARANCEUR, LABSPEC, PHURINE, GLUCOSEU, HGBUR, BILIRUBINUR, KETONESUR, PROTEINUR,  UROBILINOGEN, NITRITE, LEUKOCYTESUR Sepsis Labs: _0 (procalcitonin:4,lacticidven:4)  ) No results found for this or any previous visit (from the past 240 hour(s)).    Studies: No results found.  Scheduled Meds: . amLODipine  10 mg Oral Daily  . buPROPion  150 mg Oral Daily  . enoxaparin (LOVENOX) injection  60 mg Subcutaneous Q24H  . mouth rinse  15 mL Mouth Rinse BID  . mouth rinse  15 mL Mouth Rinse q12n4p  . nicotine  14 mg Transdermal Daily  . nystatin   Topical TID  . venlafaxine XR  225 mg Oral Q breakfast    Continuous Infusions: . ceFEPime (MAXIPIME) IV Stopped (07/22/17 0726)     LOS: 10 days     Desiree Hane, MD [facility] Pager [phone]  If 7PM-7AM, please contact night-coverage [email or web address] Password [facility] 07/22/2017, 12:56 PM

## 2017-07-22 NOTE — Progress Notes (Signed)
PT Cancellation Note  Patient Details Name: Tara Whitehead MRN: [id number] DOB: 1961-01-28   Cancelled Treatment:     pt progressing poorly and plans to D/C to home via PTAR with Hospice.   All equipment arranged.  Even a Product/process development scientistHoyer for lift.     Felecia ShellingLori Ophie Burrowes  PTA [facility]  Acute  Rehab Pager      [phone]

## 2017-07-22 NOTE — Consult Note (Signed)
[facility]:  Met with pt's husband and pt at bedside. Answered questions regarding hospice care in home. They have equipment of hospital bed, hoyer lift, raised toilet seat and electric wheelchair. They will need equipment of oxygen high flow, suction machine with yanker suction, BI-pap and therapy vest. I have ordered this to be deliver by [facility] today to not delay any discharge plans when she is stable to be discharged. She is waiting on urology to look at her suprapubic and try to assist with the leaking.   Webb Silversmith RN [phone]

## 2017-07-22 NOTE — Progress Notes (Signed)
Date:  July 22, 2017 Chart reviewed for concurrent status and case management needs.  Will continue to follow patient progress.  Discharge Planning: following for needs.  None present at this time of review.  [facility]? Expected discharge date: January 312019 Glennie Bose, BSN, [place]3, [place]M   [phone]

## 2017-07-22 NOTE — Progress Notes (Signed)
Nutrition Brief Note  Patient identified for LOS (10 days).  Wt Readings from Last 15 Encounters:  07/19/17 287 lb 7.7 oz (130.4 kg)    Body mass index is 40.1 kg/m. Patient meets criteria for morbid obesity based on current BMI. No weight hx in the chart prior to this admission. Weight on 1/23 was 255 lbs (115.8 kg) indicating 32 lb weight gain in 2 days. Unable to account for this drastic change in weight in only 2 days. Pt with PMH of ALS, anxiety, depression, HTN, arthritis s/p knee replacements. She arrived to the ED on 07/11/17 for SOB and HTN on POD #1 suprapubic catheter placement. Notes indicate leaking from catheter at this time with Urology following/to assist with this. Palliative Care following and [facility] on board. Plan for home with hospice. Palliative note from yesterday states that "overall prognosis remains poor with progressing ALS."  Current diet order is Dysphagia 3, thin liquids since 1/23 (from 1/18-1/23 pt had been NPO. Per review of doc flow sheet, pt consumed 25% of breakfast and lunch on 1/26 and on 1/25 she consumed 100% of breakfast and lunch and 50% of dinner. Labs and medications reviewed.   Will order Ensure Enlive once/day PRN, this supplement provides 350 kcal and 20 grams of protein and Boost Breeze BID PRN, each supplement provides 250 kcal and 9 grams of protein. No additional nutrition interventions warranted at this time. If nutrition issues arise, please consult RD.      Tara GammonJessica Breyon Blass, MS, RD, LDN, [facility] Inpatient Clinical Dietitian Pager # [phone] After hours/weekend pager # [phone]

## 2017-07-23 MED ORDER — LORAZEPAM 0.5 MG PO TABS: 0.5000 mg | ORAL_TABLET | Freq: Two times a day (BID) | ORAL | 0 refills | Status: AC

## 2017-07-23 MED ORDER — ALBUTEROL SULFATE (2.5 MG/3ML) 0.083% IN NEBU: 2.5000 mg | INHALATION_SOLUTION | RESPIRATORY_TRACT | 12 refills | Status: AC | PRN

## 2017-07-23 MED ORDER — NICOTINE 14 MG/24HR TD PT24: 14.0000 mg | MEDICATED_PATCH | Freq: Every day | TRANSDERMAL | 0 refills | Status: AC

## 2017-07-23 MED ORDER — TRIAMCINOLONE ACETONIDE 0.1 % EX CREA: 1.0000 "application " | TOPICAL_CREAM | Freq: Two times a day (BID) | CUTANEOUS | 0 refills | Status: AC | PRN

## 2017-07-23 MED ORDER — VENLAFAXINE HCL ER 75 MG PO CP24: 225.0000 mg | ORAL_CAPSULE | Freq: Every day | ORAL | Status: AC

## 2017-07-23 MED ORDER — OXYBUTYNIN CHLORIDE ER 10 MG PO TB24: 10.0000 mg | ORAL_TABLET | Freq: Every day | ORAL | Status: AC

## 2017-07-23 MED ORDER — ALBUTEROL SULFATE (2.5 MG/3ML) 0.083% IN NEBU: 2.5000 mg | INHALATION_SOLUTION | RESPIRATORY_TRACT | 12 refills | Status: DC | PRN

## 2017-07-23 NOTE — Consult Note (Signed)
[facility]:  All equipment is in home. Pt was advised to notify us when he leaves the hospital so that [facility]. They will go out to home and fit pt to bi-pap mask and make sure it is comfortable; along with teaching family how to use the machine. We will follow pt in home after they get there as well. Pt has oxygen for transport which the husband brought from home for pt. Thank you.  Hospital Liaision Norm ParcelCheri Kennedy RN [phone]

## 2017-07-23 NOTE — Discharge Summary (Signed)
Discharge Summary  Tara Whitehead WUJ:[id number] DOB: [birth year]  PCP: Philemon Kingdom, MD  Admit date: 07/11/2017 Discharge date: 07/25/2017  Time spent: < 25 minutes  Admitted From: Home Disposition:  Home with Hospice  Recommendations for Outpatient Follow-up:  1. Urology follow up for SPT change 2.   Home Health:Home with [facility] Equipment/Devices: Oxygen high flow, suction machine with negative suction, BiPAP, diabetes  Discharge Diagnoses:  Active Hospital Problems   Diagnosis Date Noted  . ALS (amyotrophic lateral sclerosis) (HCC)   . Goals of care, counseling/discussion   . Palliative care encounter   . HCAP (healthcare-associated pneumonia)   . Pressure injury of skin 07/12/2017  . SOB (shortness of breath)   . Acute on chronic respiratory failure with hypoxia (HCC)   . Neurogenic bladder disorder 07/11/2017    Resolved Hospital Problems  No resolved problems to display.    Discharge Condition: Home with hospice  CODE STATUS: DNR Diet recommendation: Regular  Vitals:   07/23/17 0800 07/23/17 1200  BP:    Pulse:    Resp:    Temp: 97.8 F (36.6 C) (!) 97.5 F (36.4 C)  SpO2:      History of present illness:  Tara Whitehead is a 58 y.o. year old female with medical history significant for ALS, anxiety, depression, HTN, arthritis, s/p knee replacementswho presented on 07/11/2017 with SOB and HTN at 1 day postop suprapubic catheter placement and intravesical botox injection was found to have acute on chronic hypoxic respiratory failure secondary to progressive ALS and HCAP.   Hospital Course:  Active Problems:   Neurogenic bladder disorder   Pressure injury of skin   SOB (shortness of breath)   Acute on chronic respiratory failure with hypoxia (HCC)   HCAP (healthcare-associated pneumonia)   ALS (amyotrophic lateral sclerosis) (HCC)   Goals of care, counseling/discussion   Palliative care encounter   Acute on chronic respiratory failure,  multifactorial etiology Worsening respiratory status happened shortly after completing urologic procedure (Botox instillation on 07/11/17).  Patient was treated with 10 total days of IV cefepime for likely HCAP.  Throughout hospital stay patient required intermittent use of BiPAP and high flow oxygen.  Her persistent use of high amounts of oxygen deemed likely secondary to progressive worsening of ALS.  Palliative care was consulted and patient elected to go home with hospice to receive additional support (equipment mentioned above).   Neurogenic bladder status post suprapubic catheter, Botox instillation on 07/11/17. Prior to discharge Urology evaluated patient.  Suprapubic catheter in place.  Urology follow-up appointment arranged.  Sinus tachycardia, secondary to dehydration.  Improved with increased oral hydration and time IV fluids.    Hypertension.home amlodipine was increased to 10 mg during hospital stay.  Depression/anxiety.  Home Wellbutrin and Effexor were continued  Stage 1 pressure injury of sacrum, present on admission    Consultations:  Urology, pulmonology, palliative care  Procedures:  TTE on 07/12/17: EF 60-65%.  No regional wall motion normalities.  Grade 1 diastolic dysfunction.  Antimicrobials:  Cefepime 1/18>>>1/28  Doxycycline 1/18  Vancomycin 1/18-1/20   Cultures:  07/12/17: Blood culture x2- growth to date      Procedures/Studies: Dg Chest 1 View  Result Date: 07/12/2017 CLINICAL DATA:  Difficulty breathing. EXAM: CHEST 1 VIEW COMPARISON:  Chest x-ray 05/14/2008. FINDINGS: Large left pleural effusion noted. Decreased left lung volume suggesting with dense left base consistent with atelectasis. Underlying infiltrate cannot be excluded. Elevation left hemidiaphragm. No pneumothorax. Small right pleural effusion cannot be excluded. No pneumothorax.  Heart size is difficult to assess due to opacity of the left chest. Degenerative change thoracic  spine. IMPRESSION: 1.  Left lower lobe atelectasis and left pleural effusion. 2.  Small right pleural effusion cannot be excluded. Electronically Signed   By: Maisie Fus  Register   On: 07/12/2017 10:48   Dg Chest Port 1 View  Result Date: 07/18/2017 CLINICAL DATA:  Acute respiratory failure EXAM: PORTABLE CHEST 1 VIEW COMPARISON:  Portable exam 0952 hrs compared to 07/17/2017 FINDINGS: Enlargement of cardiac silhouette with pulmonary vascular congestion. Bibasilar infiltrates persist, slightly increased on RIGHT. Question minimal perihilar infiltrate or edema. Suspect small LEFT pleural effusion. No pneumothorax or acute osseous findings. IMPRESSION: Bibasilar infiltrates persistent on LEFT and slightly increased on RIGHT. Suspect small LEFT pleural effusion. Electronically Signed   By: Ulyses Southward M.D.   On: 07/18/2017 10:16   Dg Chest Port 1 View  Result Date: 07/17/2017 CLINICAL DATA:  Hypoxia EXAM: PORTABLE CHEST 1 VIEW COMPARISON:  07/16/2017 FINDINGS: Cardiac shadow is stable. The lungs are well aerated bilaterally with bibasilar infiltrative changes left greater than right. The overall appearance is stable from the prior exam. No new focal abnormality is seen. No bony abnormality is noted. IMPRESSION: Stable bibasilar infiltrates. Electronically Signed   By: Alcide Clever M.D.   On: 07/17/2017 07:17   Dg Chest Port 1 View  Result Date: 07/16/2017 CLINICAL DATA:  Respiratory failure, acute on chronic, current smoker. EXAM: PORTABLE CHEST 1 VIEW COMPARISON:  Portable chest x-ray of July 15, 2017 FINDINGS: The lungs are adequately inflated. Bibasilar densities are present and have increased on the right though are stable on the left. The right hemidiaphragm is now obscured. The cardiac silhouette is normal in size. The pulmonary vascularity is not clearly engorged. The mediastinum is normal in width. The observed bony thorax is unremarkable. IMPRESSION: Worsening bibasilar atelectasis or pneumonia.  Probable small left pleural effusion. No pulmonary edema. Electronically Signed   By: David  [place] M.D.   On: 07/16/2017 07:26   Dg Chest Port 1 View  Result Date: 07/15/2017 CLINICAL DATA:  Hypoxia EXAM: PORTABLE CHEST 1 VIEW COMPARISON:  Chest x-ray of July 14, 2017 FINDINGS: The right lung is adequately inflated. On the left there is persistent density at the lung base which has not greatly changed. There is no pneumothorax. The heart and pulmonary vascularity are normal. The mediastinum is normal in width. The bony thorax is unremarkable. IMPRESSION: Persistent left lower lobe atelectasis or pneumonia and small effusion. Minimal right basilar subsegmental atelectasis. No CHF. Electronically Signed   By: David  [place] M.D.   On: 07/15/2017 08:09   Dg Chest Port 1 View  Result Date: 07/14/2017 CLINICAL DATA:  57 year old female with history of hypoxia. EXAM: PORTABLE CHEST 1 VIEW COMPARISON:  Chest x-ray 07/13/2017. FINDINGS: Lung volumes remain low. There continues to be extensive airspace consolidation throughout the left lower lobe (and to a lesser extent in the right lower lobe), with slight improved aeration overall compared to yesterday's examination. Small left pleural effusion. No pneumothorax. No evidence of pulmonary edema. Heart size appears normal. Upper mediastinal contours are within normal limits. IMPRESSION: 1. Slight improvement in left lower lobe pneumonia. 2. Atelectasis and/or consolidation in the right lower lobe, similar to the prior study. 3. Small left pleural effusion. Electronically Signed   By: Trudie Reed M.D.   On: 07/14/2017 07:23   Dg Chest Port 1 View  Result Date: 07/13/2017 CLINICAL DATA:  Acute respiratory failure EXAM: PORTABLE CHEST 1 VIEW  COMPARISON:  Yesterday FINDINGS: Low volume chest with left greater than right lung opacity. Heart size is largely obscured and could be enlarged. Interstitial coarsening without Kerley lines. No effusion or pneumothorax.  IMPRESSION: 1. Improved aeration compared yesterday. 2. Left greater than right atelectasis and/or pneumonia. Electronically Signed   By: Marnee Spring M.D.   On: 07/13/2017 07:17   Dg Swallowing Func-speech Pathology  Result Date: 07/17/2017 Objective Swallowing Evaluation: Type of Study: MBS-Modified Barium Swallow Study  Patient Details Name: TAYLEN WENDLAND MRN: [id number] Date of Birth: 03/12/1961 Today's Date: 07/17/2017 Time: SLP Start Time (ACUTE ONLY): 1425 -SLP Stop Time (ACUTE ONLY): 1500 SLP Time Calculation (min) (ACUTE ONLY): 35 min Past Medical History: Past Medical History: Diagnosis Date . ALS (amyotrophic lateral sclerosis) (HCC) 11/23/2016 . Anxiety  . Arthritis  . Complication of anesthesia   unable to do spinal at one of surgeries  . Depression  . Dyspnea   due to ALS  . Hypertension  . Neuromuscular disorder (HCC)   ALS  Past Surgical History: Past Surgical History: Procedure Laterality Date . APPENDECTOMY   . BILATERAL CARPAL TUNNEL RELEASE   . BOTOX INJECTION N/A 07/11/2017  Procedure: BOTOX INJECTION;  Surgeon: Bjorn Pippin, MD;  Location: [facility];  Service: Urology;  Laterality: N/A; . INSERTION OF SUPRAPUBIC CATHETER N/A 07/11/2017  Procedure: INSERTION OF SUPRAPUBIC CATHETER;  Surgeon: Bjorn Pippin, MD;  Location: [facility];  Service: Urology;  Laterality: N/A; . JOINT REPLACEMENT    3 knee replacements HPI: 57 yo female with ALS admitted to [facility] with respiratory failure.   Pt required Bipap and some discussion re: possible trach/PEG took place.  Today pt is on high flow oxygen and has been off Bipap.  Swallow eval ordered and pt seen yesterday for clinical evaluation.  Follow-up indicated to determine readiness for instrumental swallow evaluation.    Subjective: pt awake in chair Assessment / Plan / Recommendation CHL IP CLINICAL IMPRESSIONS 07/17/2017 Clinical Impression  Patient presents with mild oropharyngeal dysphagia with decreased oral bolus cohesion, premature spillage into pharynx and  mild tongue base residuals.  Often pt would initiate dry swallow to facilitate oropharyngeal clearance.  Decreased laryngeal elevation/closure results in laryngeal penetration and trace aspiration of thin liquids and penetration of nectar.  Trace secretions noted to mix with barium spilling into larynx/trachea.  Chin tuck posture decreased penetration but did not fully prevent it consistently.  In addition increased viscocity of any bolus resulted in worsening backflow through UES without pt adequate sensation.  Again dry swallow helpful to decrease.  Pharyngeal swallow with solids/puree was strong without penetration or aspiration nor significant residuals.  Pt did not sense trace aspiration of thin - causing SLP to suspect low grade chronic aspiration of secretions and water intake.  Although pt only tracely aspirated, she has increased risk of pulmonary compromise with any aspiration.  Unfortunately pt could not consistently clear penetrates fully with cued cough/throat clear.  Due to pt's weakness from her ALS, she is aspirating secretions and given level of pulmonary weakness, minimal aspiration concerning for pulmonary clearance.  Educated pt/family to findings/recommendations using teach back.  Informed pt and spouse that feeding tube would not prevent her from aspiration of secretions.    If pt desires diet and MDs are agreeable, would recommend dys3/thin - using chin tuck, follow solids with liquids, multiple swallows and medicine with puree - whole.  SLP to follow up for RMST to strengthen cough/airway protection and subsequent submental musculature to aid swallow.  Pt  continued to indicate her initial reaction after MBS was that she did not want a feeding tube.   SLP Visit Diagnosis Dysphagia, oropharyngeal phase (R13.12);Dysphagia, pharyngoesophageal phase (R13.14) Attention and concentration deficit following -- Frontal lobe and executive function deficit following -- Impact on safety and function Mild  aspiration risk   CHL IP TREATMENT RECOMMENDATION 07/17/2017 Treatment Recommendations Therapy as outlined in treatment plan below   Prognosis 07/17/2017 Prognosis for Safe Diet Advancement Fair Barriers to Reach Goals Severity of deficits;Other (Comment) Barriers/Prognosis Comment -- CHL IP DIET RECOMMENDATION 07/17/2017 SLP Diet Recommendations (No Data) Liquid Administration via -- Medication Administration Whole meds with puree Compensations Slow rate;Small sips/bites;Chin tuck;Follow solids with liquid Postural Changes Remain semi-upright after after feeds/meals (Comment);Seated upright at 90 degrees   CHL IP OTHER RECOMMENDATIONS 07/17/2017 Recommended Consults -- Oral Care Recommendations Oral care QID Other Recommendations --   CHL IP FOLLOW UP RECOMMENDATIONS 07/17/2017 Follow up Recommendations (No Data)   CHL IP FREQUENCY AND DURATION 07/17/2017 Speech Therapy Frequency (ACUTE ONLY) min 2x/week Treatment Duration 1 week      CHL IP ORAL PHASE 07/17/2017 Oral Phase Impaired Oral - Pudding Teaspoon -- Oral - Pudding Cup -- Oral - Honey Teaspoon -- Oral - Honey Cup -- Oral - Nectar Teaspoon -- Oral - Nectar Cup Reduced posterior propulsion;Premature spillage;Lingual/palatal residue;Decreased bolus cohesion Oral - Nectar Straw Premature spillage;Piecemeal swallowing;Lingual/palatal residue;Decreased bolus cohesion Oral - Thin Teaspoon Premature spillage;Piecemeal swallowing;Lingual/palatal residue;Decreased bolus cohesion Oral - Thin Cup Premature spillage;Piecemeal swallowing;Lingual/palatal residue;Decreased bolus cohesion Oral - Thin Straw Piecemeal swallowing;Lingual/palatal residue;Decreased bolus cohesion Oral - Puree Piecemeal swallowing;Premature spillage;Lingual/palatal residue Oral - Mech Soft Decreased bolus cohesion;Premature spillage;Piecemeal swallowing;Lingual/palatal residue Oral - Regular -- Oral - Multi-Consistency -- Oral - Pill Lingual/palatal residue;Piecemeal swallowing;Premature spillage Oral  Phase - Comment --  CHL IP PHARYNGEAL PHASE 07/17/2017 Pharyngeal Phase Impaired Pharyngeal- Pudding Teaspoon -- Pharyngeal -- Pharyngeal- Pudding Cup -- Pharyngeal -- Pharyngeal- Honey Teaspoon -- Pharyngeal -- Pharyngeal- Honey Cup -- Pharyngeal -- Pharyngeal- Nectar Teaspoon -- Pharyngeal -- Pharyngeal- Nectar Cup Reduced airway/laryngeal closure;Penetration/Aspiration during swallow;Pharyngeal residue - pyriform;Pharyngeal residue - cp segment Pharyngeal Material enters airway, remains ABOVE vocal cords and not ejected out Pharyngeal- Nectar Straw Delayed swallow initiation-pyriform sinuses;Reduced laryngeal elevation;Reduced airway/laryngeal closure;Penetration/Aspiration during swallow;Penetration/Aspiration before swallow;Pharyngeal residue - pyriform;Pharyngeal residue - cp segment Pharyngeal Material enters airway, remains ABOVE vocal cords and not ejected out Pharyngeal- Thin Teaspoon Reduced laryngeal elevation;Reduced airway/laryngeal closure;Pharyngeal residue - cp segment;Pharyngeal residue - pyriform;Penetration/Aspiration during swallow Pharyngeal Material enters airway, remains ABOVE vocal cords and not ejected out Pharyngeal- Thin Cup Reduced laryngeal elevation;Reduced airway/laryngeal closure;Pharyngeal residue - pyriform;Pharyngeal residue - cp segment;Penetration/Aspiration during swallow Pharyngeal Material enters airway, passes BELOW cords without attempt by patient to eject out (silent aspiration) Pharyngeal- Thin Straw Reduced laryngeal elevation;Reduced airway/laryngeal closure;Reduced tongue base retraction;Pharyngeal residue - pyriform;Pharyngeal residue - cp segment Pharyngeal -- Pharyngeal- Puree Reduced laryngeal elevation;Reduced airway/laryngeal closure;Pharyngeal residue - pyriform;Pharyngeal residue - cp segment Pharyngeal -- Pharyngeal- Mechanical Soft Reduced laryngeal elevation;Reduced airway/laryngeal closure;Pharyngeal residue - pyriform;Pharyngeal residue - cp segment  Pharyngeal -- Pharyngeal- Regular -- Pharyngeal -- Pharyngeal- Multi-consistency -- Pharyngeal -- Pharyngeal- Pill Reduced laryngeal elevation;Reduced airway/laryngeal closure;Pharyngeal residue - cp segment;Pharyngeal residue - pyriform Pharyngeal -- Pharyngeal Comment pt with backflow of barium from UES without awareness, dry swallows helpful to decrease, chin tuck posture did not fully prevent penetration, cued cough/throat clear not consistently effective to clear TRACE penetrates and TRACE aspiration   CHL IP CERVICAL ESOPHAGEAL PHASE 07/17/2017 Cervical Esophageal Phase Impaired Pudding Teaspoon --  Pudding Cup -- Honey Teaspoon -- Honey Cup -- Nectar Teaspoon -- Nectar Cup Reduced cricopharyngeal relaxation;Esophageal backflow into the pharynx Nectar Straw Reduced cricopharyngeal relaxation;Esophageal backflow into the pharynx Thin Teaspoon Reduced cricopharyngeal relaxation;Esophageal backflow into the pharynx Thin Cup Reduced cricopharyngeal relaxation;Esophageal backflow into the pharynx Thin Straw Esophageal backflow into the pharynx;Reduced cricopharyngeal relaxation Puree Reduced cricopharyngeal relaxation;Esophageal backflow into the pharynx Mechanical Soft Esophageal backflow into the pharynx;Reduced cricopharyngeal relaxation Regular -- Multi-consistency -- Pill Reduced cricopharyngeal relaxation;Esophageal backflow into the pharynx Cervical Esophageal Comment could not sweep further distally into esophagus secondary to pt being in her motorized wheelchair No flowsheet data found. Chales Abrahams 07/17/2017, 4:48 PM    Donavan Burnet, MS [facility] SLP [phone]            Discharge Exam: BP (!) 156/69   Pulse 91   Temp (!) 97.5 F (36.4 C) (Oral)   Resp 20   Ht 5\' 11"  (1.803 m)   Wt 132.1 kg (291 lb 3.6 oz)   SpO2 93%   BMI 40.62 kg/m   General: obese female, lying in bed, no apparent distress Cardiovascular: RRR, no appreciable murmurs, rubs or gallops  Respiratory: On HF nasal cannula,no  respiratory distress, decreased effort and breath sounds throughout. No appreciable rhonchi, wheezing, or rales Skin: No Rash Neurologic: Mental status AAOx3, speech normal, Psychiatric:Appropriate affect, and mood     Discharge Instructions You were cared for by a hospitalist during your hospital stay. If you have any questions about your discharge medications or the care you received while you were in the hospital after you are discharged, you can call the unit and asked to speak with the hospitalist on call if the hospitalist that took care of you is not available. Once you are discharged, your primary care physician will handle any further medical issues. Please note that NO REFILLS for any discharge medications will be authorized once you are discharged, as it is imperative that you return to your primary care physician (or establish a relationship with a primary care physician if you do not have one) for your aftercare needs so that they can reassess your need for medications and monitor your lab values.  Discharge Instructions    Care order/instruction   Complete by:  As directed    Irrigate the Suprapubic catheter tube PRN.   Diet - low sodium heart healthy   Complete by:  As directed    Increase activity slowly   Complete by:  As directed      Allergies as of 07/23/2017      Reactions   Chlorhexidine Gluconate Itching, Rash   CHG WIPES---HIBICLENS WIPES      Medication List    TAKE these medications   albuterol (2.5 MG/3ML) 0.083% nebulizer solution Commonly known as:  PROVENTIL Take 3 mLs (2.5 mg total) by nebulization every 4 (four) hours as needed for wheezing (shortness of breadth, wheezing).   amLODipine 5 MG tablet Commonly known as:  NORVASC Take 5 mg by mouth daily.   buPROPion 150 MG 24 hr tablet Commonly known as:  WELLBUTRIN XL Take 150 mg by mouth daily.   clotrimazole 1 % cream Commonly known as:  LOTRIMIN Apply 1 application topically 2 (two) times  daily as needed (for itching in groin area mix triamcinolone with clotrimazole 1:1).   ibuprofen 200 MG tablet Commonly known as:  ADVIL,MOTRIN Take 400 mg by mouth every 8 (eight) hours as needed (for pain.).   JUICE PLUS FIBRE PO Take 6  tablets by mouth daily. Fruit/Vegetable/Berry Blend Capsules Take 2 capsule of each variety in the morning.   Krill Oil 1000 MG Caps Take 1,000 mg by mouth daily.   LORazepam 0.5 MG tablet Commonly known as:  ATIVAN Take 1 tablet (0.5 mg total) by mouth 2 (two) times daily.   meloxicam 7.5 MG tablet Commonly known as:  MOBIC Take 15 mg by mouth daily.   METHYL B-12 PO Take 1 tablet by mouth daily.   nicotine 14 mg/24hr patch Commonly known as:  NICODERM CQ - dosed in mg/24 hours Place 1 patch (14 mg total) onto the skin daily.   oxybutynin 10 MG 24 hr tablet Commonly known as:  DITROPAN-XL Take 1 tablet (10 mg total) by mouth daily.   PROBIOTIC PO Take 1 capsule by mouth daily. 80 Billion   triamcinolone cream 0.1 % Commonly known as:  KENALOG Apply 1 application topically 2 (two) times daily as needed (for itching in groin area mix triamcinolone with clotrimazole 1:1).   venlafaxine XR 75 MG 24 hr capsule Commonly known as:  EFFEXOR-XR Take 3 capsules (225 mg total) by mouth daily with breakfast.      Allergies  Allergen Reactions  . Chlorhexidine Gluconate Itching and Rash    CHG WIPES---HIBICLENS WIPES   Follow-up Information    Jetta Lout, NP Follow up on 08/13/2017.   Specialty:  Urology Why:  [address] information: [address] [place] [address] [phone]        [facility] Follow up.   Why:  Home Hospice RN - will call to arrange initial appointment Contact information: [address] [place] [address] [phone]        Bjorn Pippin, MD. Call in 4 weeks.   Specialty:  Urology Why:  If you don't hear from my office in 48 hours please call. Contact information: [address] [place]  [address] [place] [address] [phone]            The results of significant diagnostics from this hospitalization (including imaging, microbiology, ancillary and laboratory) are listed below for reference.    Significant Diagnostic Studies: Dg Chest 1 View  Result Date: 07/12/2017 CLINICAL DATA:  Difficulty breathing. EXAM: CHEST 1 VIEW COMPARISON:  Chest x-ray 05/14/2008. FINDINGS: Large left pleural effusion noted. Decreased left lung volume suggesting with dense left base consistent with atelectasis. Underlying infiltrate cannot be excluded. Elevation left hemidiaphragm. No pneumothorax. Small right pleural effusion cannot be excluded. No pneumothorax. Heart size is difficult to assess due to opacity of the left chest. Degenerative change thoracic spine. IMPRESSION: 1.  Left lower lobe atelectasis and left pleural effusion. 2.  Small right pleural effusion cannot be excluded. Electronically Signed   By: Maisie Fus  Register   On: 07/12/2017 10:48   Dg Chest Port 1 View  Result Date: 07/18/2017 CLINICAL DATA:  Acute respiratory failure EXAM: PORTABLE CHEST 1 VIEW COMPARISON:  Portable exam 0952 hrs compared to 07/17/2017 FINDINGS: Enlargement of cardiac silhouette with pulmonary vascular congestion. Bibasilar infiltrates persist, slightly increased on RIGHT. Question minimal perihilar infiltrate or edema. Suspect small LEFT pleural effusion. No pneumothorax or acute osseous findings. IMPRESSION: Bibasilar infiltrates persistent on LEFT and slightly increased on RIGHT. Suspect small LEFT pleural effusion. Electronically Signed   By: Ulyses Southward M.D.   On: 07/18/2017 10:16   Dg Chest Port 1 View  Result Date: 07/17/2017 CLINICAL DATA:  Hypoxia EXAM: PORTABLE CHEST 1 VIEW COMPARISON:  07/16/2017 FINDINGS: Cardiac shadow is stable. The  lungs are well aerated bilaterally with bibasilar infiltrative changes left greater than right. The overall appearance is stable from the prior exam. No new focal  abnormality is seen. No bony abnormality is noted. IMPRESSION: Stable bibasilar infiltrates. Electronically Signed   By: Alcide Clever M.D.   On: 07/17/2017 07:17   Dg Chest Port 1 View  Result Date: 07/16/2017 CLINICAL DATA:  Respiratory failure, acute on chronic, current smoker. EXAM: PORTABLE CHEST 1 VIEW COMPARISON:  Portable chest x-ray of July 15, 2017 FINDINGS: The lungs are adequately inflated. Bibasilar densities are present and have increased on the right though are stable on the left. The right hemidiaphragm is now obscured. The cardiac silhouette is normal in size. The pulmonary vascularity is not clearly engorged. The mediastinum is normal in width. The observed bony thorax is unremarkable. IMPRESSION: Worsening bibasilar atelectasis or pneumonia. Probable small left pleural effusion. No pulmonary edema. Electronically Signed   By: David  [place] M.D.   On: 07/16/2017 07:26   Dg Chest Port 1 View  Result Date: 07/15/2017 CLINICAL DATA:  Hypoxia EXAM: PORTABLE CHEST 1 VIEW COMPARISON:  Chest x-ray of July 14, 2017 FINDINGS: The right lung is adequately inflated. On the left there is persistent density at the lung base which has not greatly changed. There is no pneumothorax. The heart and pulmonary vascularity are normal. The mediastinum is normal in width. The bony thorax is unremarkable. IMPRESSION: Persistent left lower lobe atelectasis or pneumonia and small effusion. Minimal right basilar subsegmental atelectasis. No CHF. Electronically Signed   By: David  [place] M.D.   On: 07/15/2017 08:09   Dg Chest Port 1 View  Result Date: 07/14/2017 CLINICAL DATA:  57 year old female with history of hypoxia. EXAM: PORTABLE CHEST 1 VIEW COMPARISON:  Chest x-ray 07/13/2017. FINDINGS: Lung volumes remain low. There continues to be extensive airspace consolidation throughout the left lower lobe (and to a lesser extent in the right lower lobe), with slight improved aeration overall compared to  yesterday's examination. Small left pleural effusion. No pneumothorax. No evidence of pulmonary edema. Heart size appears normal. Upper mediastinal contours are within normal limits. IMPRESSION: 1. Slight improvement in left lower lobe pneumonia. 2. Atelectasis and/or consolidation in the right lower lobe, similar to the prior study. 3. Small left pleural effusion. Electronically Signed   By: Trudie Reed M.D.   On: 07/14/2017 07:23   Dg Chest Port 1 View  Result Date: 07/13/2017 CLINICAL DATA:  Acute respiratory failure EXAM: PORTABLE CHEST 1 VIEW COMPARISON:  Yesterday FINDINGS: Low volume chest with left greater than right lung opacity. Heart size is largely obscured and could be enlarged. Interstitial coarsening without Kerley lines. No effusion or pneumothorax. IMPRESSION: 1. Improved aeration compared yesterday. 2. Left greater than right atelectasis and/or pneumonia. Electronically Signed   By: Marnee Spring M.D.   On: 07/13/2017 07:17   Dg Swallowing Func-speech Pathology  Result Date: 07/17/2017 Objective Swallowing Evaluation: Type of Study: MBS-Modified Barium Swallow Study  Patient Details Name: OPIE FANTON MRN: [id number] Date of Birth: 1961-06-07 Today's Date: 07/17/2017 Time: SLP Start Time (ACUTE ONLY): 1425 -SLP Stop Time (ACUTE ONLY): 1500 SLP Time Calculation (min) (ACUTE ONLY): 35 min Past Medical History: Past Medical History: Diagnosis Date . ALS (amyotrophic lateral sclerosis) (HCC) 11/23/2016 . Anxiety  . Arthritis  . Complication of anesthesia   unable to do spinal at one of surgeries  . Depression  . Dyspnea   due to ALS  . Hypertension  . Neuromuscular disorder (HCC)  ALS  Past Surgical History: Past Surgical History: Procedure Laterality Date . APPENDECTOMY   . BILATERAL CARPAL TUNNEL RELEASE   . BOTOX INJECTION N/A 07/11/2017  Procedure: BOTOX INJECTION;  Surgeon: Bjorn PippinWrenn, John, MD;  Location: [facility];  Service: Urology;  Laterality: N/A; . INSERTION OF SUPRAPUBIC CATHETER  N/A 07/11/2017  Procedure: INSERTION OF SUPRAPUBIC CATHETER;  Surgeon: Bjorn PippinWrenn, John, MD;  Location: [facility];  Service: Urology;  Laterality: N/A; . JOINT REPLACEMENT    3 knee replacements HPI: 57 yo female with [facility].   Pt required Bipap and some discussion re: possible trach/PEG took place.  Today pt is on high flow oxygen and has been off Bipap.  Swallow eval ordered and pt seen yesterday for clinical evaluation.  Follow-up indicated to determine readiness for instrumental swallow evaluation.    Subjective: pt awake in chair Assessment / Plan / Recommendation CHL IP CLINICAL IMPRESSIONS 07/17/2017 Clinical Impression  Patient presents with mild oropharyngeal dysphagia with decreased oral bolus cohesion, premature spillage into pharynx and mild tongue base residuals.  Often pt would initiate dry swallow to facilitate oropharyngeal clearance.  Decreased laryngeal elevation/closure results in laryngeal penetration and trace aspiration of thin liquids and penetration of nectar.  Trace secretions noted to mix with barium spilling into larynx/trachea.  Chin tuck posture decreased penetration but did not fully prevent it consistently.  In addition increased viscocity of any bolus resulted in worsening backflow through UES without pt adequate sensation.  Again dry swallow helpful to decrease.  Pharyngeal swallow with solids/puree was strong without penetration or aspiration nor significant residuals.  Pt did not sense trace aspiration of thin - causing SLP to suspect low grade chronic aspiration of secretions and water intake.  Although pt only tracely aspirated, she has increased risk of pulmonary compromise with any aspiration.  Unfortunately pt could not consistently clear penetrates fully with cued cough/throat clear.  Due to pt's weakness from her ALS, she is aspirating secretions and given level of pulmonary weakness, minimal aspiration concerning for pulmonary clearance.   Educated pt/family to findings/recommendations using teach back.  Informed pt and spouse that feeding tube would not prevent her from aspiration of secretions.    If pt desires diet and MDs are agreeable, would recommend dys3/thin - using chin tuck, follow solids with liquids, multiple swallows and medicine with puree - whole.  SLP to follow up for RMST to strengthen cough/airway protection and subsequent submental musculature to aid swallow.  Pt continued to indicate her initial reaction after MBS was that she did not want a feeding tube.   SLP Visit Diagnosis Dysphagia, oropharyngeal phase (R13.12);Dysphagia, pharyngoesophageal phase (R13.14) Attention and concentration deficit following -- Frontal lobe and executive function deficit following -- Impact on safety and function Mild aspiration risk   CHL IP TREATMENT RECOMMENDATION 07/17/2017 Treatment Recommendations Therapy as outlined in treatment plan below   Prognosis 07/17/2017 Prognosis for Safe Diet Advancement Fair Barriers to Reach Goals Severity of deficits;Other (Comment) Barriers/Prognosis Comment -- CHL IP DIET RECOMMENDATION 07/17/2017 SLP Diet Recommendations (No Data) Liquid Administration via -- Medication Administration Whole meds with puree Compensations Slow rate;Small sips/bites;Chin tuck;Follow solids with liquid Postural Changes Remain semi-upright after after feeds/meals (Comment);Seated upright at 90 degrees   CHL IP OTHER RECOMMENDATIONS 07/17/2017 Recommended Consults -- Oral Care Recommendations Oral care QID Other Recommendations --   CHL IP FOLLOW UP RECOMMENDATIONS 07/17/2017 Follow up Recommendations (No Data)   CHL IP FREQUENCY AND DURATION 07/17/2017 Speech Therapy Frequency (ACUTE ONLY)  min 2x/week Treatment Duration 1 week      CHL IP ORAL PHASE 07/17/2017 Oral Phase Impaired Oral - Pudding Teaspoon -- Oral - Pudding Cup -- Oral - Honey Teaspoon -- Oral - Honey Cup -- Oral - Nectar Teaspoon -- Oral - Nectar Cup Reduced posterior  propulsion;Premature spillage;Lingual/palatal residue;Decreased bolus cohesion Oral - Nectar Straw Premature spillage;Piecemeal swallowing;Lingual/palatal residue;Decreased bolus cohesion Oral - Thin Teaspoon Premature spillage;Piecemeal swallowing;Lingual/palatal residue;Decreased bolus cohesion Oral - Thin Cup Premature spillage;Piecemeal swallowing;Lingual/palatal residue;Decreased bolus cohesion Oral - Thin Straw Piecemeal swallowing;Lingual/palatal residue;Decreased bolus cohesion Oral - Puree Piecemeal swallowing;Premature spillage;Lingual/palatal residue Oral - Mech Soft Decreased bolus cohesion;Premature spillage;Piecemeal swallowing;Lingual/palatal residue Oral - Regular -- Oral - Multi-Consistency -- Oral - Pill Lingual/palatal residue;Piecemeal swallowing;Premature spillage Oral Phase - Comment --  CHL IP PHARYNGEAL PHASE 07/17/2017 Pharyngeal Phase Impaired Pharyngeal- Pudding Teaspoon -- Pharyngeal -- Pharyngeal- Pudding Cup -- Pharyngeal -- Pharyngeal- Honey Teaspoon -- Pharyngeal -- Pharyngeal- Honey Cup -- Pharyngeal -- Pharyngeal- Nectar Teaspoon -- Pharyngeal -- Pharyngeal- Nectar Cup Reduced airway/laryngeal closure;Penetration/Aspiration during swallow;Pharyngeal residue - pyriform;Pharyngeal residue - cp segment Pharyngeal Material enters airway, remains ABOVE vocal cords and not ejected out Pharyngeal- Nectar Straw Delayed swallow initiation-pyriform sinuses;Reduced laryngeal elevation;Reduced airway/laryngeal closure;Penetration/Aspiration during swallow;Penetration/Aspiration before swallow;Pharyngeal residue - pyriform;Pharyngeal residue - cp segment Pharyngeal Material enters airway, remains ABOVE vocal cords and not ejected out Pharyngeal- Thin Teaspoon Reduced laryngeal elevation;Reduced airway/laryngeal closure;Pharyngeal residue - cp segment;Pharyngeal residue - pyriform;Penetration/Aspiration during swallow Pharyngeal Material enters airway, remains ABOVE vocal cords and not ejected out  Pharyngeal- Thin Cup Reduced laryngeal elevation;Reduced airway/laryngeal closure;Pharyngeal residue - pyriform;Pharyngeal residue - cp segment;Penetration/Aspiration during swallow Pharyngeal Material enters airway, passes BELOW cords without attempt by patient to eject out (silent aspiration) Pharyngeal- Thin Straw Reduced laryngeal elevation;Reduced airway/laryngeal closure;Reduced tongue base retraction;Pharyngeal residue - pyriform;Pharyngeal residue - cp segment Pharyngeal -- Pharyngeal- Puree Reduced laryngeal elevation;Reduced airway/laryngeal closure;Pharyngeal residue - pyriform;Pharyngeal residue - cp segment Pharyngeal -- Pharyngeal- Mechanical Soft Reduced laryngeal elevation;Reduced airway/laryngeal closure;Pharyngeal residue - pyriform;Pharyngeal residue - cp segment Pharyngeal -- Pharyngeal- Regular -- Pharyngeal -- Pharyngeal- Multi-consistency -- Pharyngeal -- Pharyngeal- Pill Reduced laryngeal elevation;Reduced airway/laryngeal closure;Pharyngeal residue - cp segment;Pharyngeal residue - pyriform Pharyngeal -- Pharyngeal Comment pt with backflow of barium from UES without awareness, dry swallows helpful to decrease, chin tuck posture did not fully prevent penetration, cued cough/throat clear not consistently effective to clear TRACE penetrates and TRACE aspiration   CHL IP CERVICAL ESOPHAGEAL PHASE 07/17/2017 Cervical Esophageal Phase Impaired Pudding Teaspoon -- Pudding Cup -- Honey Teaspoon -- Honey Cup -- Nectar Teaspoon -- Nectar Cup Reduced cricopharyngeal relaxation;Esophageal backflow into the pharynx Nectar Straw Reduced cricopharyngeal relaxation;Esophageal backflow into the pharynx Thin Teaspoon Reduced cricopharyngeal relaxation;Esophageal backflow into the pharynx Thin Cup Reduced cricopharyngeal relaxation;Esophageal backflow into the pharynx Thin Straw Esophageal backflow into the pharynx;Reduced cricopharyngeal relaxation Puree Reduced cricopharyngeal relaxation;Esophageal backflow  into the pharynx Mechanical Soft Esophageal backflow into the pharynx;Reduced cricopharyngeal relaxation Regular -- Multi-consistency -- Pill Reduced cricopharyngeal relaxation;Esophageal backflow into the pharynx Cervical Esophageal Comment could not sweep further distally into esophagus secondary to pt being in her motorized wheelchair No flowsheet data found. Chales Abrahams 07/17/2017, 4:48 PM    Donavan Burnet, MS [facility] SLP [phone]            Microbiology: No results found for this or any previous visit (from the past 240 hour(s)).   Labs: Basic Metabolic Panel: Recent Labs  Lab 07/19/17 0327  CREATININE <0.30*   Liver Function Tests: No results for input(s):  AST, ALT, ALKPHOS, BILITOT, PROT, ALBUMIN in the last 168 hours. No results for input(s): LIPASE, AMYLASE in the last 168 hours. No results for input(s): AMMONIA in the last 168 hours. CBC: No results for input(s): WBC, NEUTROABS, HGB, HCT, MCV, PLT in the last 168 hours. Cardiac Enzymes: No results for input(s): CKTOTAL, CKMB, CKMBINDEX, TROPONINI in the last 168 hours. BNP: BNP (last 3 results) Recent Labs    07/12/17 1158  BNP 39.5    ProBNP (last 3 results) No results for input(s): PROBNP in the last 8760 hours.  CBG: No results for input(s): GLUCAP in the last 168 hours.     Signed:  Laverna Peace, MD [facility] 07/25/2017, 7:54 PM  \

## 2017-07-23 NOTE — Progress Notes (Signed)
Patient ID: Tara Whitehead, female   DOB: 13-May-1961, 57 y.o.   MRN: [id number] CC: incontinence  Hx: I was called yesterday about urethral leakage despite the SP tube.  The tube has since been irrigated and the foley tubing readjusted, there may have been a relative kink in the tubing, and the incontinence has resolved.  .BP (!) 151/84 (BP Location: Right Arm)   Pulse 91   Temp 97.7 F (36.5 C) (Oral)   Resp (!) 26   Ht 5\' 11"  (1.803 m)   Wt 132.1 kg (291 lb 3.6 oz)   SpO2 94%   BMI 40.62 kg/m    Urine clear in the SP tubing.  AP:  If she is discharged today, please give the home health agency an order to irrigate the SP tube prn.     I will make sure she has f/u scheduled in my office for a few weeks to change the tube.

## 2017-08-13 ENCOUNTER — Other Ambulatory Visit: Payer: Self-pay

## 2017-08-13 NOTE — Patient Outreach (Signed)
[facility] Care Management  08/13/2017  Tara Whitehead 07/01/1960 [id number]   Telephone call to review health risk assessment and screen for care management needs for  [employer]. Member states that she was a diagnosis of ALS last June.  States that she is now under Hospice care since she was in the hospital last month.  States she has been recovering from pneumonia and has been slowly improving.  States she has good family support and the Hospice has been very helpful. Discussed [facility] Care Management and that since she has Hospice services she does not qualify at this time for care management.  Instructed that if her situation changes and she no longer has hospice she can contact [facility].   Successful outreach letter sent. Member enrolled in an external program. Dudley MajorMelissa Sandlin RN, [facility],CCM Care Management Coordinator [facility] [phone]

## 2017-08-20 DIAGNOSIS — R3914 Feeling of incomplete bladder emptying: Secondary | ICD-10-CM | POA: Diagnosis not present

## 2017-08-20 DIAGNOSIS — N3941 Urge incontinence: Secondary | ICD-10-CM | POA: Diagnosis not present

## 2017-08-20 DIAGNOSIS — N3001 Acute cystitis with hematuria: Secondary | ICD-10-CM | POA: Diagnosis not present

## 2017-09-18 DIAGNOSIS — N3020 Other chronic cystitis without hematuria: Secondary | ICD-10-CM | POA: Diagnosis not present

## 2017-09-18 DIAGNOSIS — R3914 Feeling of incomplete bladder emptying: Secondary | ICD-10-CM | POA: Diagnosis not present

## 2017-09-18 DIAGNOSIS — N3941 Urge incontinence: Secondary | ICD-10-CM | POA: Diagnosis not present

## 2017-10-02 DIAGNOSIS — G1221 Amyotrophic lateral sclerosis: Secondary | ICD-10-CM | POA: Diagnosis not present

## 2017-10-16 DIAGNOSIS — N3941 Urge incontinence: Secondary | ICD-10-CM | POA: Diagnosis not present

## 2017-11-20 DIAGNOSIS — G1221 Amyotrophic lateral sclerosis: Secondary | ICD-10-CM | POA: Diagnosis not present

## 2017-11-20 DIAGNOSIS — N3941 Urge incontinence: Secondary | ICD-10-CM | POA: Diagnosis not present

## 2018-01-01 DIAGNOSIS — G1221 Amyotrophic lateral sclerosis: Secondary | ICD-10-CM | POA: Diagnosis not present

## 2018-01-01 DIAGNOSIS — Z993 Dependence on wheelchair: Secondary | ICD-10-CM | POA: Diagnosis not present

## 2018-01-01 DIAGNOSIS — R253 Fasciculation: Secondary | ICD-10-CM | POA: Diagnosis not present

## 2018-01-01 DIAGNOSIS — R05 Cough: Secondary | ICD-10-CM | POA: Diagnosis not present

## 2018-01-01 DIAGNOSIS — R292 Abnormal reflex: Secondary | ICD-10-CM | POA: Diagnosis not present

## 2018-01-01 DIAGNOSIS — R0602 Shortness of breath: Secondary | ICD-10-CM | POA: Diagnosis not present

## 2018-01-01 DIAGNOSIS — Z9181 History of falling: Secondary | ICD-10-CM | POA: Diagnosis not present

## 2018-01-01 DIAGNOSIS — R531 Weakness: Secondary | ICD-10-CM | POA: Diagnosis not present

## 2018-01-01 DIAGNOSIS — R29898 Other symptoms and signs involving the musculoskeletal system: Secondary | ICD-10-CM | POA: Diagnosis not present

## 2018-02-23 DEATH — deceased

## 2019-10-23 IMAGING — RF DG SWALLOWING FUNCTION - NRPT [facility]
17 of 19 series · 20 of 24 positions shown · non-contrast
Comparison: none

[Series 1: cp_standard · 0.34mm/px · 2 of 104 frames shown (1 of 17)]
[frame 16/104]
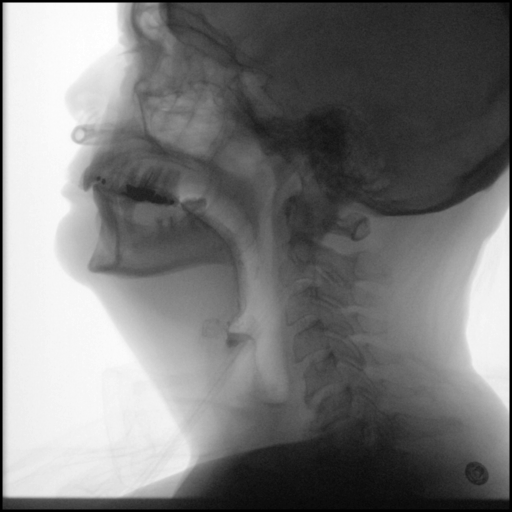
[frame 104/104]
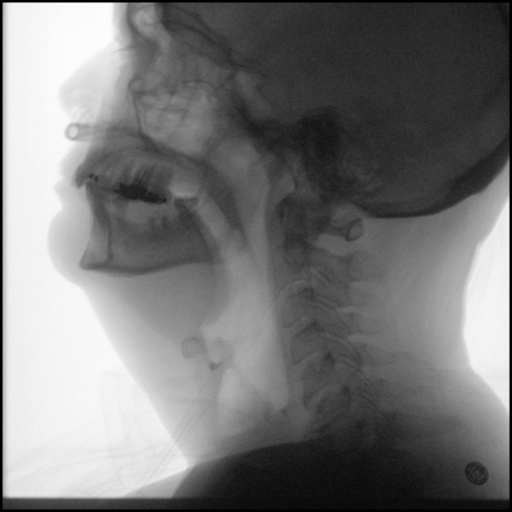

[Series 3: cp_standard · 0.34mm/px · 1 of 227 frames shown (2 of 17)]
[frame 128/227]
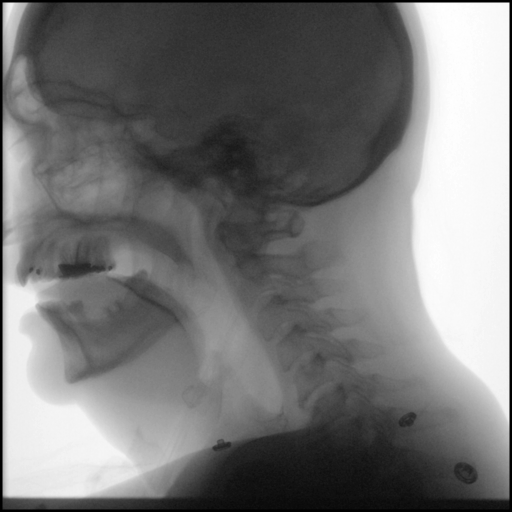

[Series 4: cp_standard · 0.34mm/px · 1 of 153 frames shown (3 of 17)]
[frame 77/153]
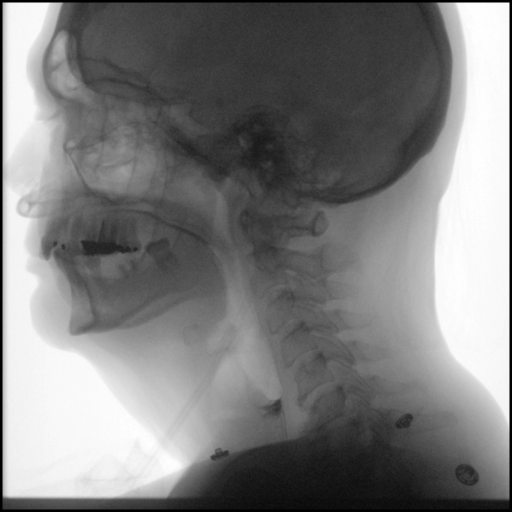

[Series 5: cp_standard · 0.34mm/px · 1 of 63 frames shown (4 of 17)]
[frame 10/63]
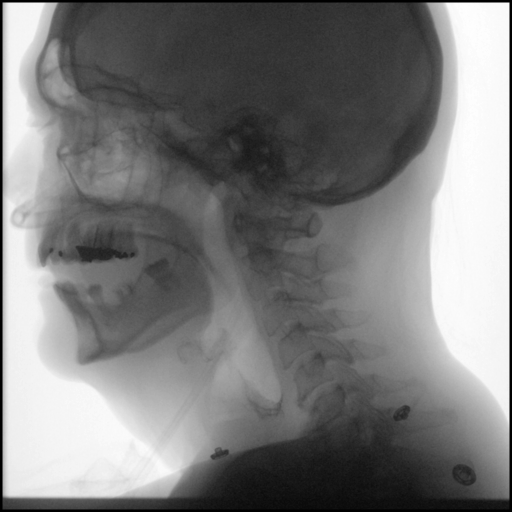

[Series 6: cp_standard · 0.34mm/px · 1 of 138 frames shown (5 of 17)]
[frame 21/138]
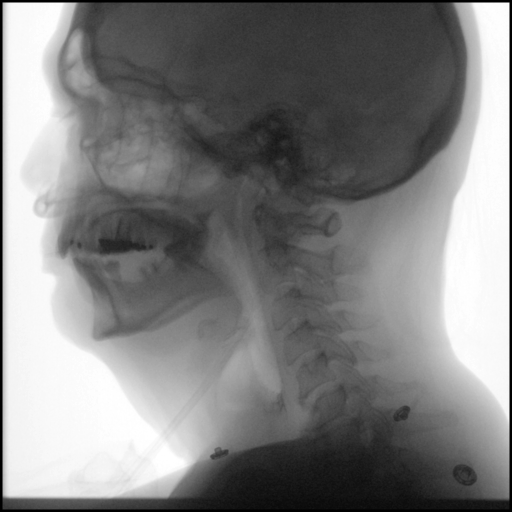

[Series 7: cp_standard · 0.34mm/px · 1 of 60 frames shown (6 of 17)]
[frame 4/60]
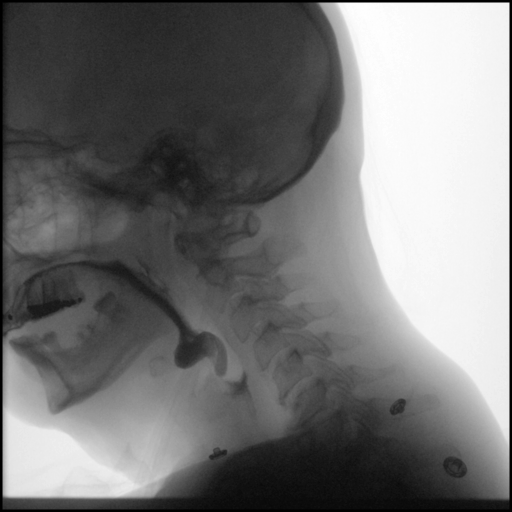

[Series 8: cp_standard · 0.34mm/px · 1 of 68 frames shown (7 of 17)]
[frame 58/68]
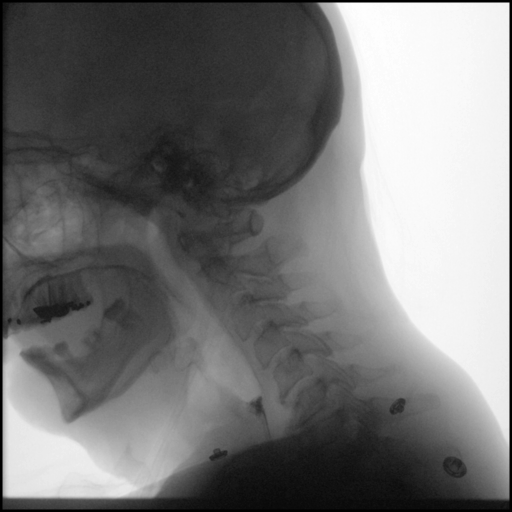

[Series 9: cp_standard · 0.34mm/px · 1 of 40 frames shown (8 of 17)]
[frame 21/40]
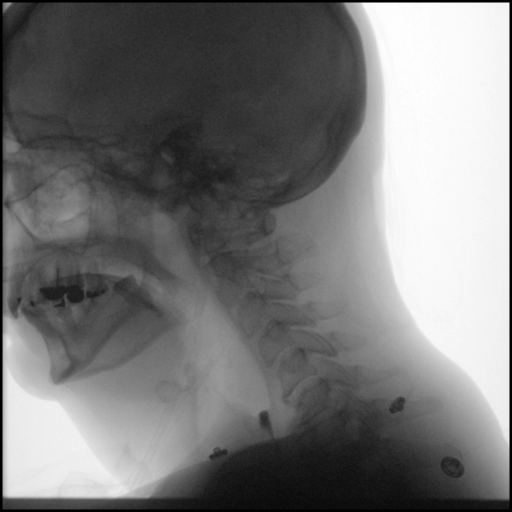

[Series 10: cp_standard · 0.34mm/px · 1 of 105 frames shown (9 of 17)]
[frame 16/105]
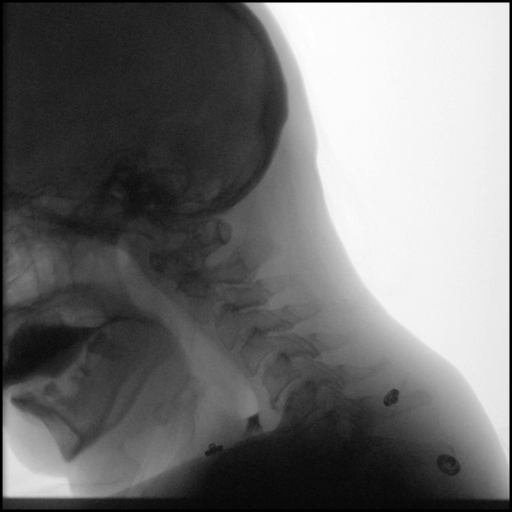

[Series 11: cp_standard · 0.34mm/px · 1 of 97 frames shown (10 of 17)]
[frame 15/97]
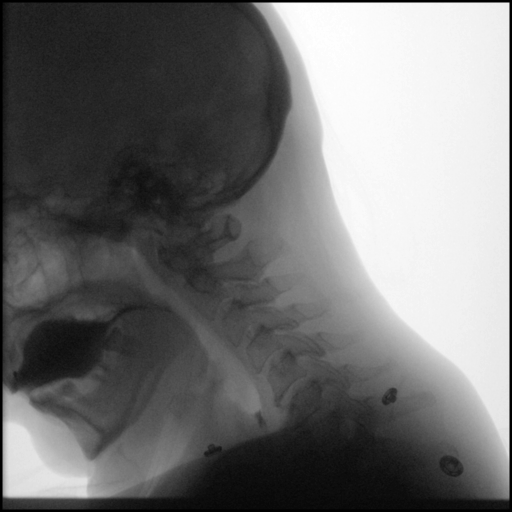

[Series 12: cp_standard · 0.34mm/px · 1 of 152 frames shown (11 of 17)]
[frame 6/152]
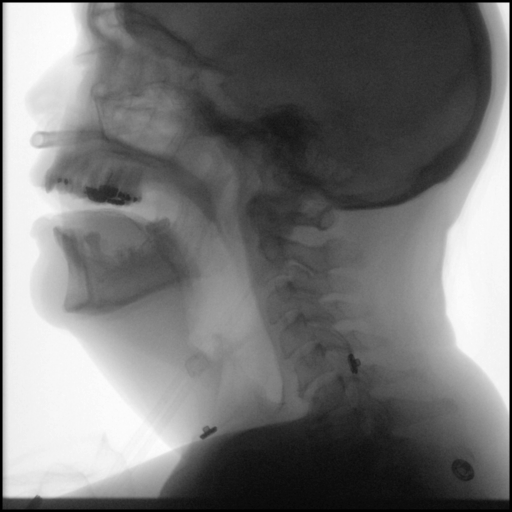

[Series 14: cp_standard · 0.34mm/px · 2 of 181 frames shown (12 of 17)]
[frame 22/181]
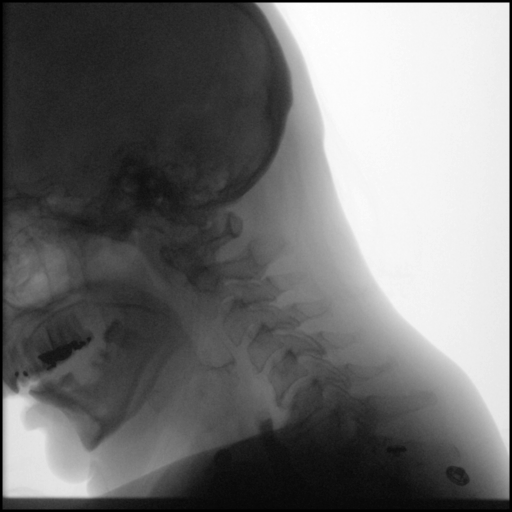
[frame 154/181]
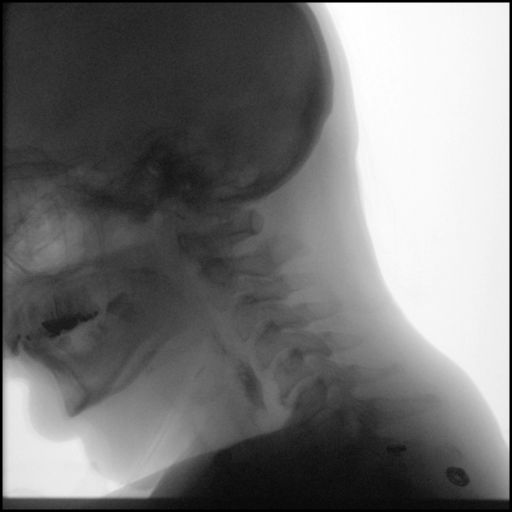

[Series 15: cp_standard · 0.34mm/px · 1 of 223 frames shown (13 of 17)]
[frame 190/223]
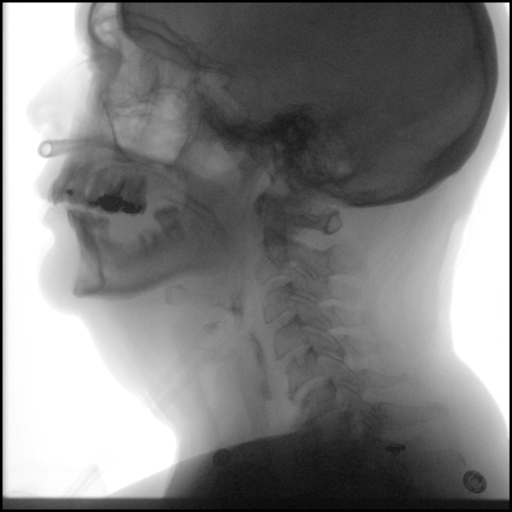

[Series 16: cp_standard · 0.34mm/px · 1 of 203 frames shown (14 of 17)]
[frame 102/203]
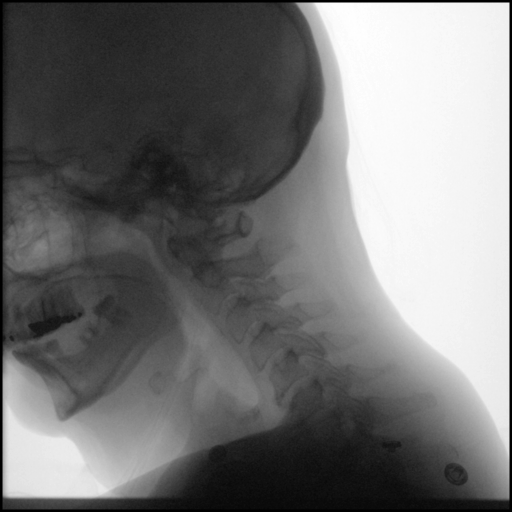

[Series 17: cp_standard · 0.34mm/px · 1 of 98 frames shown (15 of 17)]
[frame 15/98]
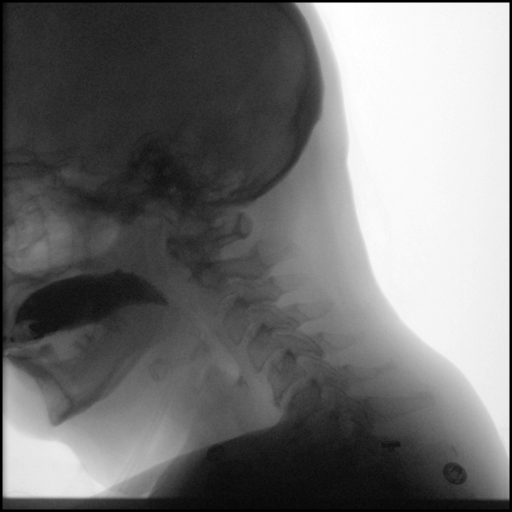

[Series 19: cp_standard · 0.34mm/px · 1 of 282 frames shown (16 of 17)]
[frame 25/282]
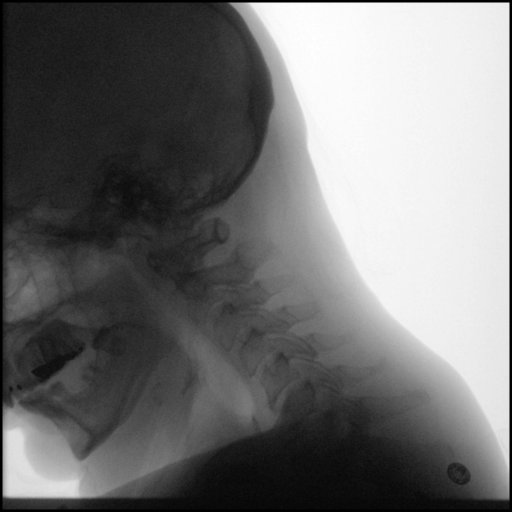

[Series 20: cp_standard · 0.34mm/px · 2 of 117 frames shown (17 of 17)]
[frame 18/117]
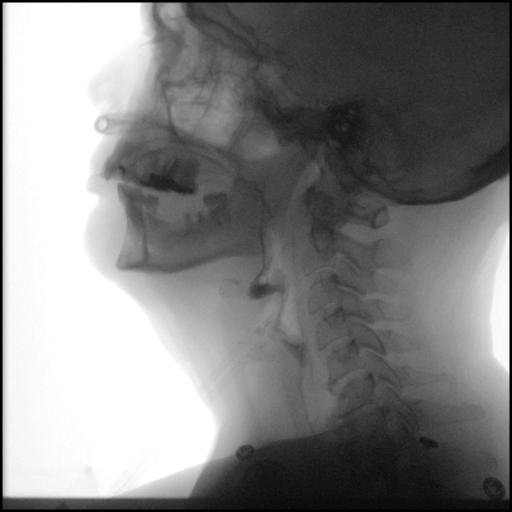
[frame 105/117]
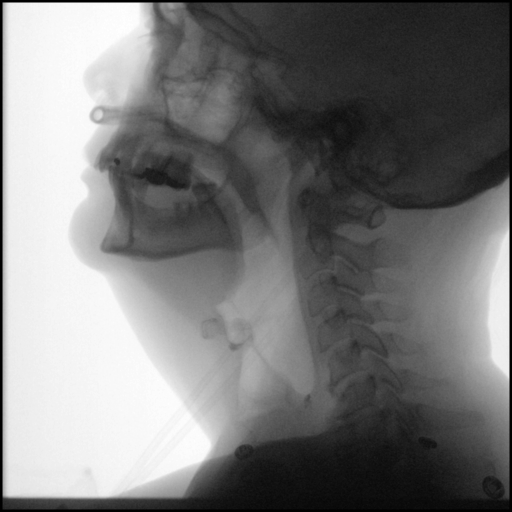

[20 of 24 positions shown; findings below may reference images not displayed]

FLUOROSCOPY FOR SWALLOWING FUNCTION STUDY:
Fluoroscopy was provided for swallowing function study, which was administered by a speech pathologist.  Final results and recommendations from this study are contained within the speech pathology report.
